# Patient Record
Sex: Male | Born: 1945 | Race: White | Hispanic: No | Marital: Married | Smoking: Former smoker
Health system: Southern US, Community
[De-identification: ages and names within clinical notes are randomized; demographics above are authoritative.]

## PROBLEM LIST (undated history)

## (undated) DIAGNOSIS — I2510 Atherosclerotic heart disease of native coronary artery without angina pectoris: Secondary | ICD-10-CM

## (undated) DIAGNOSIS — L0211 Cutaneous abscess of neck: Secondary | ICD-10-CM

## (undated) DIAGNOSIS — R0602 Shortness of breath: Secondary | ICD-10-CM

## (undated) DIAGNOSIS — F419 Anxiety disorder, unspecified: Secondary | ICD-10-CM

## (undated) DIAGNOSIS — E039 Hypothyroidism, unspecified: Secondary | ICD-10-CM

## (undated) DIAGNOSIS — G4730 Sleep apnea, unspecified: Secondary | ICD-10-CM

## (undated) DIAGNOSIS — F32A Depression, unspecified: Secondary | ICD-10-CM

## (undated) DIAGNOSIS — K219 Gastro-esophageal reflux disease without esophagitis: Secondary | ICD-10-CM

## (undated) DIAGNOSIS — I422 Other hypertrophic cardiomyopathy: Secondary | ICD-10-CM

## (undated) DIAGNOSIS — I10 Essential (primary) hypertension: Secondary | ICD-10-CM

## (undated) DIAGNOSIS — A4902 Methicillin resistant Staphylococcus aureus infection, unspecified site: Secondary | ICD-10-CM

## (undated) DIAGNOSIS — F329 Major depressive disorder, single episode, unspecified: Secondary | ICD-10-CM

## (undated) DIAGNOSIS — Z9289 Personal history of other medical treatment: Secondary | ICD-10-CM

## (undated) HISTORY — DX: Cutaneous abscess of neck: L02.11

## (undated) HISTORY — DX: Essential (primary) hypertension: I10

## (undated) HISTORY — DX: Personal history of other medical treatment: Z92.89

## (undated) HISTORY — PX: BACK SURGERY: SHX140

## (undated) HISTORY — DX: Methicillin resistant Staphylococcus aureus infection, unspecified site: A49.02

## (undated) HISTORY — PX: ABSCESS DRAINAGE: SHX1119

## (undated) HISTORY — DX: Atherosclerotic heart disease of native coronary artery without angina pectoris: I25.10

## (undated) HISTORY — DX: Other hypertrophic cardiomyopathy: I42.2

---

## 1999-12-31 ENCOUNTER — Emergency Department (HOSPITAL_COMMUNITY): Admission: EM | Admit: 1999-12-31 | Discharge: 1999-12-31 | Payer: Self-pay | Admitting: Emergency Medicine

## 2000-01-02 ENCOUNTER — Encounter: Admission: RE | Admit: 2000-01-02 | Discharge: 2000-04-01 | Payer: Self-pay | Admitting: Orthopedic Surgery

## 2000-04-20 ENCOUNTER — Encounter: Admission: RE | Admit: 2000-04-20 | Discharge: 2000-07-19 | Payer: Self-pay | Admitting: Orthopedic Surgery

## 2000-07-20 ENCOUNTER — Encounter: Admission: RE | Admit: 2000-07-20 | Discharge: 2000-10-18 | Payer: Self-pay | Admitting: Orthopedic Surgery

## 2000-07-29 ENCOUNTER — Emergency Department (HOSPITAL_COMMUNITY): Admission: EM | Admit: 2000-07-29 | Discharge: 2000-07-29 | Payer: Self-pay | Admitting: Emergency Medicine

## 2000-11-02 ENCOUNTER — Encounter: Admission: RE | Admit: 2000-11-02 | Discharge: 2001-01-31 | Payer: Self-pay | Admitting: Orthopedic Surgery

## 2001-02-01 ENCOUNTER — Encounter: Admission: RE | Admit: 2001-02-01 | Discharge: 2001-05-02 | Payer: Self-pay | Admitting: Orthopedic Surgery

## 2001-05-24 ENCOUNTER — Encounter: Admission: RE | Admit: 2001-05-24 | Discharge: 2001-08-22 | Payer: Self-pay | Admitting: Orthopedic Surgery

## 2002-01-03 ENCOUNTER — Encounter (HOSPITAL_BASED_OUTPATIENT_CLINIC_OR_DEPARTMENT_OTHER): Admission: RE | Admit: 2002-01-03 | Discharge: 2002-03-10 | Payer: Self-pay | Admitting: Internal Medicine

## 2002-01-12 ENCOUNTER — Ambulatory Visit (HOSPITAL_COMMUNITY): Admission: RE | Admit: 2002-01-12 | Discharge: 2002-01-12 | Payer: Self-pay | Admitting: Orthopedic Surgery

## 2002-01-12 ENCOUNTER — Encounter: Payer: Self-pay | Admitting: Orthopedic Surgery

## 2002-03-30 ENCOUNTER — Encounter (HOSPITAL_BASED_OUTPATIENT_CLINIC_OR_DEPARTMENT_OTHER): Admission: RE | Admit: 2002-03-30 | Discharge: 2002-06-28 | Payer: Self-pay | Admitting: Internal Medicine

## 2002-05-21 ENCOUNTER — Inpatient Hospital Stay (HOSPITAL_COMMUNITY): Admission: EM | Admit: 2002-05-21 | Discharge: 2002-05-25 | Payer: Self-pay | Admitting: Emergency Medicine

## 2002-05-21 ENCOUNTER — Encounter: Payer: Self-pay | Admitting: Emergency Medicine

## 2002-05-22 ENCOUNTER — Encounter: Payer: Self-pay | Admitting: Cardiology

## 2002-05-23 ENCOUNTER — Encounter: Payer: Self-pay | Admitting: Cardiology

## 2002-05-24 ENCOUNTER — Encounter: Payer: Self-pay | Admitting: Internal Medicine

## 2002-07-14 ENCOUNTER — Encounter (HOSPITAL_BASED_OUTPATIENT_CLINIC_OR_DEPARTMENT_OTHER): Admission: RE | Admit: 2002-07-14 | Discharge: 2002-10-12 | Payer: Self-pay | Admitting: Internal Medicine

## 2003-01-04 ENCOUNTER — Encounter (HOSPITAL_BASED_OUTPATIENT_CLINIC_OR_DEPARTMENT_OTHER): Admission: RE | Admit: 2003-01-04 | Discharge: 2003-04-04 | Payer: Self-pay | Admitting: Internal Medicine

## 2003-04-09 ENCOUNTER — Observation Stay (HOSPITAL_COMMUNITY): Admission: RE | Admit: 2003-04-09 | Discharge: 2003-04-10 | Payer: Self-pay | Admitting: Urology

## 2003-04-09 ENCOUNTER — Encounter (INDEPENDENT_AMBULATORY_CARE_PROVIDER_SITE_OTHER): Payer: Self-pay

## 2003-07-16 ENCOUNTER — Ambulatory Visit (HOSPITAL_COMMUNITY): Admission: RE | Admit: 2003-07-16 | Discharge: 2003-07-16 | Payer: Self-pay | Admitting: Urology

## 2003-08-20 ENCOUNTER — Ambulatory Visit (HOSPITAL_COMMUNITY): Admission: RE | Admit: 2003-08-20 | Discharge: 2003-08-20 | Payer: Self-pay | Admitting: Urology

## 2003-12-21 ENCOUNTER — Inpatient Hospital Stay (HOSPITAL_BASED_OUTPATIENT_CLINIC_OR_DEPARTMENT_OTHER): Admission: RE | Admit: 2003-12-21 | Discharge: 2003-12-21 | Payer: Self-pay | Admitting: Cardiology

## 2004-05-16 ENCOUNTER — Encounter (INDEPENDENT_AMBULATORY_CARE_PROVIDER_SITE_OTHER): Payer: Self-pay | Admitting: *Deleted

## 2004-05-16 ENCOUNTER — Inpatient Hospital Stay (HOSPITAL_COMMUNITY): Admission: AD | Admit: 2004-05-16 | Discharge: 2004-05-19 | Payer: Self-pay | Admitting: General Surgery

## 2004-07-01 ENCOUNTER — Encounter (HOSPITAL_BASED_OUTPATIENT_CLINIC_OR_DEPARTMENT_OTHER): Admission: RE | Admit: 2004-07-01 | Discharge: 2004-09-10 | Payer: Self-pay | Admitting: Internal Medicine

## 2004-09-16 ENCOUNTER — Encounter (HOSPITAL_BASED_OUTPATIENT_CLINIC_OR_DEPARTMENT_OTHER): Admission: RE | Admit: 2004-09-16 | Discharge: 2004-12-10 | Payer: Self-pay | Admitting: Surgery

## 2004-12-12 ENCOUNTER — Encounter (HOSPITAL_BASED_OUTPATIENT_CLINIC_OR_DEPARTMENT_OTHER): Admission: RE | Admit: 2004-12-12 | Discharge: 2005-03-12 | Payer: Self-pay | Admitting: Surgery

## 2005-03-24 ENCOUNTER — Encounter (HOSPITAL_BASED_OUTPATIENT_CLINIC_OR_DEPARTMENT_OTHER): Admission: RE | Admit: 2005-03-24 | Discharge: 2005-06-22 | Payer: Self-pay | Admitting: Surgery

## 2005-06-23 ENCOUNTER — Encounter (HOSPITAL_BASED_OUTPATIENT_CLINIC_OR_DEPARTMENT_OTHER): Admission: RE | Admit: 2005-06-23 | Discharge: 2005-09-21 | Payer: Self-pay | Admitting: Surgery

## 2005-09-28 ENCOUNTER — Encounter (HOSPITAL_BASED_OUTPATIENT_CLINIC_OR_DEPARTMENT_OTHER): Admission: RE | Admit: 2005-09-28 | Discharge: 2005-12-27 | Payer: Self-pay | Admitting: Surgery

## 2005-10-12 ENCOUNTER — Ambulatory Visit: Payer: Self-pay | Admitting: Internal Medicine

## 2005-10-21 ENCOUNTER — Ambulatory Visit: Payer: Self-pay

## 2005-12-30 ENCOUNTER — Encounter (HOSPITAL_BASED_OUTPATIENT_CLINIC_OR_DEPARTMENT_OTHER): Admission: RE | Admit: 2005-12-30 | Discharge: 2006-03-11 | Payer: Self-pay | Admitting: Surgery

## 2006-03-31 ENCOUNTER — Encounter (HOSPITAL_BASED_OUTPATIENT_CLINIC_OR_DEPARTMENT_OTHER): Admission: RE | Admit: 2006-03-31 | Discharge: 2006-04-19 | Payer: Self-pay | Admitting: Internal Medicine

## 2006-05-10 ENCOUNTER — Inpatient Hospital Stay (HOSPITAL_COMMUNITY): Admission: AD | Admit: 2006-05-10 | Discharge: 2006-05-14 | Payer: Self-pay | Admitting: Internal Medicine

## 2006-05-11 ENCOUNTER — Encounter: Payer: Self-pay | Admitting: Vascular Surgery

## 2006-05-13 ENCOUNTER — Encounter (INDEPENDENT_AMBULATORY_CARE_PROVIDER_SITE_OTHER): Payer: Self-pay | Admitting: *Deleted

## 2006-09-23 ENCOUNTER — Ambulatory Visit: Payer: Self-pay | Admitting: Internal Medicine

## 2006-10-20 ENCOUNTER — Ambulatory Visit: Payer: Self-pay | Admitting: Internal Medicine

## 2006-10-20 ENCOUNTER — Encounter: Payer: Self-pay | Admitting: Internal Medicine

## 2007-03-20 ENCOUNTER — Inpatient Hospital Stay (HOSPITAL_COMMUNITY): Admission: EM | Admit: 2007-03-20 | Discharge: 2007-03-23 | Payer: Self-pay | Admitting: Emergency Medicine

## 2007-04-04 ENCOUNTER — Encounter: Admission: RE | Admit: 2007-04-04 | Discharge: 2007-04-04 | Payer: Self-pay | Admitting: Internal Medicine

## 2007-04-18 ENCOUNTER — Encounter: Admission: RE | Admit: 2007-04-18 | Discharge: 2007-04-18 | Payer: Self-pay | Admitting: Internal Medicine

## 2008-07-25 ENCOUNTER — Observation Stay (HOSPITAL_COMMUNITY): Admission: EM | Admit: 2008-07-25 | Discharge: 2008-07-26 | Payer: Self-pay | Admitting: Emergency Medicine

## 2008-07-25 ENCOUNTER — Ambulatory Visit: Payer: Self-pay | Admitting: Internal Medicine

## 2009-10-31 ENCOUNTER — Encounter (INDEPENDENT_AMBULATORY_CARE_PROVIDER_SITE_OTHER): Payer: Self-pay | Admitting: *Deleted

## 2009-11-14 ENCOUNTER — Emergency Department (HOSPITAL_COMMUNITY): Admission: EM | Admit: 2009-11-14 | Discharge: 2009-11-14 | Payer: Self-pay | Admitting: Emergency Medicine

## 2009-12-03 ENCOUNTER — Encounter (HOSPITAL_BASED_OUTPATIENT_CLINIC_OR_DEPARTMENT_OTHER): Admission: RE | Admit: 2009-12-03 | Discharge: 2010-02-06 | Payer: Self-pay | Admitting: General Surgery

## 2009-12-06 ENCOUNTER — Ambulatory Visit: Payer: Self-pay | Admitting: Vascular Surgery

## 2010-01-20 ENCOUNTER — Encounter: Payer: Self-pay | Admitting: Internal Medicine

## 2010-02-25 ENCOUNTER — Encounter (INDEPENDENT_AMBULATORY_CARE_PROVIDER_SITE_OTHER): Payer: Self-pay | Admitting: *Deleted

## 2010-02-26 ENCOUNTER — Encounter (INDEPENDENT_AMBULATORY_CARE_PROVIDER_SITE_OTHER): Payer: Self-pay | Admitting: *Deleted

## 2010-02-26 ENCOUNTER — Ambulatory Visit: Payer: Self-pay | Admitting: Internal Medicine

## 2010-03-12 ENCOUNTER — Ambulatory Visit: Payer: Self-pay | Admitting: Internal Medicine

## 2010-03-22 ENCOUNTER — Encounter: Payer: Self-pay | Admitting: Internal Medicine

## 2010-07-15 NOTE — Letter (Signed)
Summary: [facility] Instructions  [facility]  [address] [place], [place] [address]   Phone: [phone]  Fax: [phone]       [name]    [birth year]    MRN: [id number]        Procedure Day Dorna Bloom:  [facility]  03/12/10     Arrival Time:  9:30AM     Procedure Time:  10:30AM     Location of Procedure:                    _ X_   Endoscopy Center (4th Floor)                       PREPARATION FOR COLONOSCOPY WITH MOVIPREP   Starting 5 days prior to your procedure 03/07/10 do not eat nuts, seeds, popcorn, corn, beans, peas,  salads, or any raw vegetables.  Do not take any fiber supplements (e.g. Metamucil, Citrucel, and Benefiber).  THE DAY BEFORE YOUR PROCEDURE         DATE: 03/11/10  DAY: TUESDAY  1.  Drink clear liquids the entire day-NO SOLID FOOD  2.  Do not drink anything colored red or purple.  Avoid juices with pulp.  No orange juice.  3.  Drink at least 64 oz. (8 glasses) of fluid/clear liquids during the day to prevent dehydration and help the prep work efficiently.  CLEAR LIQUIDS INCLUDE: Water Jello Ice Popsicles Tea (sugar ok, no milk/cream) Powdered fruit flavored drinks Coffee (sugar ok, no milk/cream) Gatorade Juice: apple, white grape, white cranberry  Lemonade Clear bullion, consomm, broth Carbonated beverages (any kind) Strained chicken noodle soup Hard Candy                             4.  In the morning, mix first dose of MoviPrep solution:    Empty 1 Pouch A and 1 Pouch B into the disposable container    Add lukewarm drinking water to the top line of the container. Mix to dissolve    Refrigerate (mixed solution should be used within 24 hrs)  5.  Begin drinking the prep at 5:00 p.m. The MoviPrep container is divided by 4 marks.   Every 15 minutes drink the solution down to the next mark (approximately 8 oz) until the full liter is complete.   6.  Follow completed prep with 16 oz of clear liquid of your choice  (Nothing red or purple).  Continue to drink clear liquids until bedtime.  7.  Before going to bed, mix second dose of MoviPrep solution:    Empty 1 Pouch A and 1 Pouch B into the disposable container    Add lukewarm drinking water to the top line of the container. Mix to dissolve    Refrigerate  THE DAY OF YOUR PROCEDURE      DATE: 03/12/10  DAY: WEDNESDAY  Beginning at 5:30AM (5 hours before procedure):         1. Every 15 minutes, drink the solution down to the next mark (approx 8 oz) until the full liter is complete.  2. Follow completed prep with 16 oz. of clear liquid of your choice.    3. You may drink clear liquids until 8:30AM (2 HOURS BEFORE PROCEDURE).   MEDICATION INSTRUCTIONS  Unless otherwise instructed, you should take regular prescription medications with a small sip of water   as early as possible the morning of  your procedure.  Diabetic patients - see separate instructions.  Additional medication instructions: Hold Benicar HCT the morning of procedure.         OTHER INSTRUCTIONS  You will need a responsible adult at least 67 years of age to accompany you and drive you home.   This person must remain in the waiting room during your procedure.  Wear loose fitting clothing that is easily removed.  Leave jewelry and other valuables at home.  However, you may wish to bring a book to read or  an iPod/MP3 player to listen to music as you wait for your procedure to start.  Remove all body piercing jewelry and leave at home.  Total time from sign-in until discharge is approximately 2-3 hours.  You should go home directly after your procedure and rest.  You can resume normal activities the  day after your procedure.  The day of your procedure you should not:   Drive   Make legal decisions   Operate machinery   Drink alcohol   Return to work  You will receive specific instructions about eating, activities and medications before you leave.    The  above instructions have been reviewed and explained to me by   Wyona Almas RN  February 26, 2010 11:26 AM     I fully understand and can verbalize these instructions _____________________________ Date _________

## 2010-07-15 NOTE — Procedures (Signed)
Summary: Colonoscopy  Patient: [name] Note: All result statuses are Final unless otherwise noted.  Tests: (1) Colonoscopy (COL)   COL Colonoscopy           DONE     [facility]     [address]     [place], [place]  [address]           COLONOSCOPY PROCEDURE REPORT           PATIENT:  [name], [name]  MR#:  [id number]     BIRTHDATE:  [birth year], 64 yrs. old  GENDER:  male     ENDOSCOPIST:  Wilhemina Bonito. Eda Keys, MD     REF. BY:  Surveillance Program Recall     PROCEDURE DATE:  03/12/2010     PROCEDURE:  Colonoscopy with snare polypectomy x 2     ASA CLASS:  Class II     INDICATIONS:  history of pre-cancerous (adenomatous) colon polyps,     surveillance and high-risk screening     MEDICATIONS:   Fentanyl 75 mcg IV, Versed 7 mg IV           DESCRIPTION OF PROCEDURE:   After the risks benefits and     alternatives of the procedure were thoroughly explained, informed     consent was obtained.  Digital rectal exam was performed and     revealed no abnormalities.   The LB CF-[id number] endoscope     was introduced through the anus and advanced to the cecum, which     was identified by both the appendix and ileocecal valve, without     limitations.Time to cecum = 3:24 min. The quality of the prep was     excellent, using MoviPrep.  The instrument was then slowly     withdrawn (time = 12:00 min) as the colon was fully examined.     <<PROCEDUREIMAGES>>           FINDINGS:  A 5mm pedunculated polyp was found in the ascending     colon. Polyp was snared without cautery. Retrieval was successful.     A diminutive polyp was found in the mid transverse colon. Polyp     was snared without cautery. Retrieval was unsuccessful.  Moderate     diverticulosis was found in the  ascending colon and sigmoid     colon.   Retroflexed views in the rectum revealed no     abnormalities.    The scope was then withdrawn from the patient     and the procedure completed.        COMPLICATIONS:  None           ENDOSCOPIC IMPRESSION:     1) Pedunculated polyp in the ascending colon - removed     2) Diminutive polyp in the mid transverse colon - removed     3) Moderate diverticulosis ascending colon and sigmoid colon           RECOMMENDATIONS:     1) Follow up colonoscopy in 5 years           ______________________________     Wilhemina Bonito. Eda Keys, MD           CC:  Jarome Matin, MD;The Patient           n.     Rosalie DoctorWilhemina Bonito. Eda Keys at 03/12/2010 12:07 PM           Izora Ribas, [id number]  Note:  An exclamation mark (!) indicates a result that was not dispersed into the flowsheet. Document Creation Date: 03/12/2010 12:08 PM _______________________________________________________________________  (1) Order result status: Final Collection or observation date-time: 03/12/2010 12:00 Requested date-time:  Receipt date-time:  Reported date-time:  Referring Physician:   Ordering Physician: Fransico Setters [phone]) Specimen Source:  Source: Launa Grill Order Number: [id number] Lab site:   Appended Document: Colonoscopy recall 5 yrs     Procedures Next Due Date:    Colonoscopy: 03/2015

## 2010-07-15 NOTE — Letter (Signed)
Summary: Colonoscopy Letter  [facility]  [address] [place], [place] [address]   Phone: [phone]  Fax: [phone]      Oct 31, 2009 MRN: [id number]   [name] [address] [place], [place]  [address]   Dear [name],   According to your medical record, it is time for you to schedule a Colonoscopy. The American Cancer Society recommends this procedure as a method to detect early colon cancer. Patients with a family history of colon cancer, or a personal history of colon polyps or inflammatory bowel disease are at increased risk.  This letter has beeen generated based on the recommendations made at the time of your procedure. If you feel that in your particular situation this may no longer apply, please contact our office.  Please call our office at [phone] to schedule this appointment or to update your records at your earliest convenience.  Thank you for cooperating with [place] to provide you with the very best care possible.   Sincerely,  Wilhemina Bonito. Marina Goodell, M.D.  [facility] [phone]

## 2010-07-15 NOTE — Letter (Signed)
Summary: Patient Notice- Polyp Results  [facility]  [address] [place], [place] [address]   Phone: [phone]  Fax: [phone]        March 22, 2010 MRN: [id number]    [name] [address] [place], [place]  [address]    Dear Mr. Luger,  I am pleased to inform you that the colon polyp(s) removed during your recent colonoscopy was (were) found to be benign (no cancer detected) upon pathologic examination.  I recommend you have a repeat colonoscopy examination in 5 years to look for recurrent polyps, as having colon polyps increases your risk for having recurrent polyps or even colon cancer in the future.  Should you develop new or worsening symptoms of abdominal pain, bowel habit changes or bleeding from the rectum or bowels, please schedule an evaluation with either your primary care physician or with me.  Additional information/recommendations:  __ No further action with gastroenterology is needed at this time. Please      follow-up with your primary care physician for your other healthcare      needs.   Please call us if you are having persistent problems or have questions about your condition that have not been fully answered at this time.  Sincerely,  Hilarie Fredrickson MD  This letter has been electronically signed by your physician.  Appended Document: Patient Notice- Polyp Results letter mailed

## 2010-07-15 NOTE — Miscellaneous (Signed)
Summary: [facility] Pervisit/prep  Clinical Lists Changes  Medications: Added new medication of MOVIPREP 100 GM  SOLR (PEG-KCL-NACL-NASULF-NA ASC-C) As per prep instructions. - Signed Rx of MOVIPREP 100 GM  SOLR (PEG-KCL-NACL-NASULF-NA ASC-C) As per prep instructions.;  #1 x 0;  Signed;  Entered by: Wyona Almas RN;  Authorized by: Hilarie Fredrickson MD;  Method used: Electronically to [facility]  [address] [id number] [address] [place], [place]  [address], Ph: [phone] or [phone], Fax: [phone] Allergies: Added new allergy or adverse reaction of PENICILLIN Observations: Added new observation of NKA: F (02/26/2010 10:34)    Prescriptions: MOVIPREP 100 GM  SOLR (PEG-KCL-NACL-NASULF-NA ASC-C) As per prep instructions.  #1 x 0   Entered by:   Wyona Almas RN   Authorized by:   Hilarie Fredrickson MD   Signed by:   Wyona Almas RN on 02/26/2010   Method used:   [facility]ronically to        [facility]  [address] [id number] ([facility]ail)       [address]       [place], [place]  [address]       Ph: [phone] or [phone]       Fax: [phone]   RxID:   [id number]

## 2010-07-15 NOTE — Letter (Signed)
Summary: Previsit letter  [facility]  [address] [place], [place] [address]   Phone: [phone]  Fax: [phone]       01/20/2010 MRN: [id number]  [name] [address] [place], [place]  [address]  Dear [name],  Welcome to the Gastroenterology Division at [facility].    You are scheduled to see a nurse for your pre-procedure visit on 02-26-10 at 11am on the 3rd floor at [facility], [address]  We ask that you try to arrive at our office 15 minutes prior to your appointment time to allow for check-in.  Your nurse visit will consist of discussing your medical and surgical history, your immediate family medical history, and your medications.    Please bring a complete list of all your medications or, if you prefer, bring the medication bottles and we will list them.  We will need to be aware of both prescribed and over the counter drugs.  We will need to know exact dosage information as well.  If you are on blood thinners (Coumadin, Plavix, Aggrenox, Ticlid, etc.) please call our office today/prior to your appointment, as we need to consult with your physician about holding your medication.   Please be prepared to read and sign documents such as consent forms, a financial agreement, and acknowledgement forms.  If necessary, and with your consent, a friend or relative is welcome to sit-in on the nurse visit with you.  Please bring your insurance card so that we may make a copy of it.  If your insurance requires a referral to see a specialist, please bring your referral form from your primary care physician.  No co-pay is required for this nurse visit.     If you cannot keep your appointment, please call [phone] to cancel or reschedule prior to your appointment date.  This allows [place] the opportunity to schedule an appointment for another patient in need of care.    Thank you for choosing [facility] for your medical needs.  We  appreciate the opportunity to care for you.  Please visit [place] at our website  to learn more about our practice.                     Sincerely.                                                                                                                   The Gastroenterology Division

## 2010-07-15 NOTE — Letter (Signed)
Summary: Diabetic Instructions  [facility]  [address] [place], [place] [address]   Phone: [phone]  Fax: [phone]    WORTH KOBER 1945/07/04 MRN: [id number]    ________________________________________________________________________  _ x _   INSULIN (LONG ACTING) MEDICATION INSTRUCTIONS (Lantus, NPH, 70/30, Humulin, Novolin-N)   The day before your procedure:   Take  your regular evening dose    The day of your procedure:   Do not take your morning dose

## 2010-08-28 LAB — GLUCOSE, CAPILLARY
Glucose-Capillary: 175 mg/dL — ABNORMAL HIGH (ref 70–99)
Glucose-Capillary: 176 mg/dL — ABNORMAL HIGH (ref 70–99)

## 2010-09-01 LAB — GLUCOSE, CAPILLARY: Glucose-Capillary: 219 mg/dL — ABNORMAL HIGH (ref 70–99)

## 2010-09-30 LAB — GLUCOSE, CAPILLARY
Glucose-Capillary: 169 mg/dL — ABNORMAL HIGH (ref 70–99)
Glucose-Capillary: 181 mg/dL — ABNORMAL HIGH (ref 70–99)

## 2010-09-30 LAB — HEMOGLOBIN A1C
Hgb A1c MFr Bld: 6.3 % — ABNORMAL HIGH (ref 4.6–6.1)
Mean Plasma Glucose: 134 mg/dL

## 2010-09-30 LAB — POCT CARDIAC MARKERS
CKMB, poc: 6.2 ng/mL (ref 1.0–8.0)
Troponin i, poc: <0.05 ng/mL (ref 0.00–0.09)

## 2010-09-30 LAB — LIPID PANEL: Cholesterol: 108 mg/dL (ref 0–200)

## 2010-09-30 LAB — CARDIAC PANEL(CRET KIN+CKTOT+MB+TROPI)
CK, MB: 4 ng/mL (ref 0.3–4.0)
CK, MB: 4.5 ng/mL — ABNORMAL HIGH (ref 0.3–4.0)
Relative Index: 2.9 — ABNORMAL HIGH (ref 0.0–2.5)
Relative Index: 3.1 — ABNORMAL HIGH (ref 0.0–2.5)
Total CK: 136 U/L (ref 7–232)
Total CK: 145 U/L (ref 7–232)
Troponin I: 0.01 ng/mL (ref 0.00–0.06)

## 2010-09-30 LAB — URINALYSIS, ROUTINE W REFLEX MICROSCOPIC
Glucose, UA: NEGATIVE mg/dL
Ketones, ur: NEGATIVE mg/dL
Protein, ur: 30 mg/dL — AB

## 2010-09-30 LAB — DIFFERENTIAL
Basophils Absolute: 0.1 10*3/uL (ref 0.0–0.1)
Eosinophils Absolute: 0.3 10*3/uL (ref 0.0–0.7)
Eosinophils Relative: 3 % (ref 0–5)
Lymphocytes Relative: 25 % (ref 12–46)

## 2010-09-30 LAB — BASIC METABOLIC PANEL
BUN: 21 mg/dL (ref 6–23)
CO2: 28 mEq/L (ref 19–32)
Calcium: 8.8 mg/dL (ref 8.4–10.5)
Creatinine, Ser: 1.01 mg/dL (ref 0.4–1.5)
Creatinine, Ser: 1.09 mg/dL (ref 0.4–1.5)
GFR calc non Af Amer: >60 mL/min (ref >60)
GFR calc non Af Amer: >60 mL/min (ref >60)
Glucose, Bld: 119 mg/dL — ABNORMAL HIGH (ref 70–99)
Glucose, Bld: 194 mg/dL — ABNORMAL HIGH (ref 70–99)
Potassium: 4 mEq/L (ref 3.5–5.1)

## 2010-09-30 LAB — CBC
HCT: 42.3 % (ref 39.0–52.0)
MCHC: 34.7 g/dL (ref 30.0–36.0)
Platelets: 130 10*3/uL — ABNORMAL LOW (ref 150–400)
Platelets: 140 10*3/uL — ABNORMAL LOW (ref 150–400)
RDW: 13.3 % (ref 11.5–15.5)
RDW: 13.3 % (ref 11.5–15.5)

## 2010-09-30 LAB — CK TOTAL AND CKMB (NOT AT ARMC)
CK, MB: 5.8 ng/mL — ABNORMAL HIGH (ref 0.3–4.0)
Relative Index: 3.3 — ABNORMAL HIGH (ref 0.0–2.5)

## 2010-09-30 LAB — MICROALBUMIN / CREATININE URINE RATIO: Creatinine, Urine: 101.7 mg/dL

## 2010-09-30 LAB — PROTIME-INR: Prothrombin Time: 13.3 seconds (ref 11.6–15.2)

## 2010-10-28 NOTE — Discharge Summary (Signed)
NAME:  [name], [name] NO.:  [id number]   MEDICAL RECORD NO.:  [id number]          PATIENT TYPE:  OBV   LOCATION:  4709                         FACILITY:  [facility]   PHYSICIAN:  Verne Carrow, MDDATE OF BIRTH:  11-Jan-1946   DATE OF ADMISSION:  07/25/2008  DATE OF DISCHARGE:  07/26/2008                               DISCHARGE SUMMARY   PRIMARY CARDIOLOGIST:  Duke Salvia, MD, [facility].   PRIMARY MEDICAL DOCTOR:  Barry Dienes. Eloise Harman, M.D.   DISCHARGE DIAGNOSIS:  Noncardiac chest pain.   SECONDARY DIAGNOSES:  1. Coronary artery disease (nonobstructive per catheterization this      admission).  2. Congestive heart failure diastolic with a normal ejection fraction,  3. Obstructive sleep apnea (on continuous positive airway pressure).  4. Diabetes mellitus type 2 (with neuropathy, nephropathy,      retinopathy, status post ulcers including methicillin-resistant      Staphylococcus aureus).  5. Chronic obstructive pulmonary disease.  6. Hypertension.  7. Hypothyroidism.  8. Gout.  9. Diverticulosis (chronic constipation).  10.Degenerative joint disease (lumbar spine).   ALLERGIES:  PENICILLIN.   PROCEDURES PERFORMED DURING THIS HOSPITALIZATION:  1. EKG performed on July 25, 2008, which showed sinus bradycardia      with a rate of 57 beats per minute, T-wave flattening in leads V5      and V6, some minimal T-wave inversion in aVL, otherwise no      significant ST-T wave changes, no significant Q waves, normal axis,      and no significant changes from EKG performed on May 11, 2006.  2. Chest x-ray performed on July 25, 2008, showed no active      disease.  3. There was a cardiac catheterization on July 26, 2008, which      showed nonobstructive coronary artery disease.   BRIEF HISTORY OF PRESENT ILLNESS:  This 65 year old white male with past  medical history significant for coronary artery disease (mild lesion of  RCA per cath in 2005),  diastolic dysfunction, diabetes mellitus type 2,  obstructive sleep apnea, hypertension, and hypothyroidism, presents with  atypical chest pain.  Pain is in the mid chest and radiates to both  sides of the chest.  Pain is associated with movement and with palpation  of the pectoral muscles and not with exertion.  Pain is relieved with  rest/lying still.  The patient first noticed this pain 1 week ago and  has progressively worsened.  He has had associated shortness of breath  today with exertion.  Denies nausea, vomiting, diaphoresis, cough,  recent weight lifting, or trauma.  His appetite and energy level had  been normal.  Please note, the patient has had an intentional weight  loss of approximately 40 pounds over the last 5 months due to diet  supplementation 6 days per week.   HOSPITAL COURSE:  The patient was admitted with the intention of cycling  his cardiac enzymes to rule him out for acute coronary syndrome.  However, the patient had 3 sets of elevated CK-MB with an elevated  relative index as well.  However, his troponin I  was consistently less  than 0.05.  The patient also developed worsening of chest pain with  exertion on July 26, 2008, and considering his significant risk  factors including known prior history of coronary artery disease,  diastolic dysfunction, diabetes mellitus, hypertension, and extremely  low HDL result and elevated triglycerides as mentioned this admission,  the patient was scheduled for a diagnostic cardiac catheterization on  July 26, 2008.  The patient underwent that procedure and was seemed  to have nonobstructive coronary artery disease.  He tolerated the  procedure well with no significant complications.  The patient was also  discovered to have a low TSH level.  The results of his lab work as well  as his cardiac catheterization were discussed by Dr. Clifton James with the  patient's primary care doctor, Dr. Eloise Harman, and along with the plan  to  forego any further cardiac workup and have the patient follow up with  his primary care doctor within the next 2 weeks.  During hospital  course, the patient's vital signs remained stable and nothing  significant was noted on telemetry.  Most recent vital signs on day of  discharge were temperature 96.6 degrees Fahrenheit, BP 149/71, pulse 51,  respirations 20, and O2 saturation 96% on room air.  Prior to discharge,  the patient was given post cath instructions as well as followup  instructions with his new medication list in both oral and written form,  and at the time of discharge, he had no questions or concerns that had  not been addressed.   DISCHARGE LABORATORY DATA:  WBC 8.2, hgb 13.1, HCT 37.6, PLT count 130,  glucose range 119-194, and hemoglobin A1c was 6.3.  Sodium 136,  potassium 3.8, chloride 101, CO2 28, BUN 24, creatinine 1.09, calcium  8.8, total cholesterol 108, triglycerides 374, HDL 18, LDL 15, and VLDL  75.  Last set of cardiac markers, CK 136, CK-MB 4.0, relative index 2.9,  troponin I 0.03, TSH 0.017.  Urinalysis only abnormal values were trace  blood and 30 of protein.   FOLLOWUP PLANS AND APPOINTMENTS:  The patient will follow up with his  primary care doctor preferably within the next 2 weeks.   DISCHARGE MEDICATIONS:  1. NovoLog insulin sliding scale.  2. Benicar 40 mg p.o. daily.  3. Synthroid 200 mcg p.o. daily.  4. Protonix 40 mg p.o. daily.   Duration of discharge encounter including physician time was 35 minutes.      Jarrett Ables, [facility]      Verne Carrow, MD  Electronically Signed   MS/MEDQ  D:  07/26/2008  T:  07/27/2008  Job:  [id number]   cc:   Duke Salvia, MD, [facility]  Barry Dienes Eloise Harman, M.D.

## 2010-10-28 NOTE — H&P (Signed)
[name], [name] NO.:  [id number]   MEDICAL RECORD NO.:  [id number]          PATIENT TYPE:  EMS   LOCATION:  MAJO                         FACILITY:  [facility]   PHYSICIAN:  Barry Dienes. Eloise Harman, M.D.DATE OF BIRTH:  [birth year]   DATE OF ADMISSION:  03/20/2007  DATE OF DISCHARGE:                              HISTORY & PHYSICAL   CHIEF COMPLAINT:  Severe low back pain.   HISTORY OF PRESENT ILLNESS:  The patient is a 65 year old white male who  is well-known to me as I serve as his primary care physician.  Last  weekend he noted a gradual increase in his chronic low back pain.  The  pain initially increased throughout the week, then gradually became  somewhat less intense.  On March 18, 2007, he was standing much to the  day, cooking food for family and friends.  On March 19, 2007, when he  was getting out of his recliner, he noticed a sudden increase in his low  back pain so that he was barely able to get to his room with a walker  and assistance.  Today the pain has been quite severe and he has been  unable to walk at all, so he called 9-1-1 and presented to the [facility] Emergency Room for evaluation.  The pain continues to be quite  severe despite lying on his left side with shooting severe pain to the  right lower extremity.  He denies change in bowel or bladder function.  He has chronic bilateral numb feet from diabetic neuropathy.  In the  1980s, he had three operations on his lumbar spine for degenerative disk  disease and osteoarthritis.  His last operation was in the 50s at [facility].   PAST MEDICAL HISTORY:  1. Hypertension.  2. Diabetes mellitus, type 2, complicated by retinopathy, neuropathy,      nephropathy, and a left neuropathic foot ulcer.  3. Hypothyroidism.  4. Severe obstructive sleep apnea on CPAP.  5. Congestive heart failure with diastolic dysfunction and normal left      ventricular systolic function on December 2007  echocardiogram.  6. Gout.  7. Chronic obstructive pulmonary disease.  8. Diverticulitis.  9. Colon polyps on 2004 colonoscopy with a repeat colonoscopy planned      for this year.  10.Chronic constipation.  11.Gastroesophageal reflux disease.  12.Nonobstructive coronary artery disease by 2005 cardiac      catheterization.  13.December 2005, bilateral hidradenitis with MRSA treated by surgery      and antibiotics.  14.December 2007, left foot abscess with treated again with      debridement and antibiotics.  15.Chronic venous insufficiency with a history of venostasis ulcers.   MEDICATIONS PRIOR TO ADMISSION:  1. Aciphex 20 mg daily.  2. Benicar 40 mg daily.  3. Levothyroxine 300 mcg every Monday, Wednesday, and Friday and 200      mcg on other days of the week.  4. Multivitamin 1 tablet daily.  5. NovoLog mix 70/30 50 units twice daily with sliding scale      adjustments as needed.  6. Nitroglycerin 0.4 mg sublingual p.r.n. chest pain.  7. MiraLax 17 grams p.o. daily.   ALLERGIES:  PENICILLIN.   PAST SURGICAL HISTORY:  1. 1980s lumbar spine operation.  2. 1980s lumbar spine operation.  3. 1980s lumbar spine operation.  4. 2004 penis implant.  5. 2005 redo penis implant.  6. December 2005 bilateral carbuncle excisions.  7. He has also had several laser treatments to the retina for diabetic      retinopathy.  8. July 2005 cardiac catheterization.   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  His son,  Lorin Picket, age 20, lives with him.  He has a daughter, Karoline Caldwell, who is 81 and  lives in [place], [place].  He has no history of tobacco or  alcohol abuse.   FAMILY HISTORY:  Noncontributory.   REVIEW OF SYSTEMS:  He has chronic bilateral feet numbness and has had a  chronic left foot ulcer since December 2007.  He uses a cane or walker  to ambulate.  He denies recent change in his vision, headache, chest  pain, shortness of breath, nausea, vomiting,  diarrhea, constipation (on  MiraLax), anxiety, or depression.   INITIAL PHYSICAL EXAM:  VITAL SIGNS:  Blood pressure 175/80, pulse 65,  respirations 22, temperature 98.3, pulse oxygen saturation 98% on room  air.  GENERAL APPEARANCE:  He is an overweight white male who had twinges of  severe low back pain while lying on his left side.  HEENT:  Exam was grossly normal.  NECK:  Supple without jugular venous distention or carotid bruit.  CHEST:  Clear to auscultation.  HEART:  Regular rate and rhythm without significant murmur or gallop.  ABDOMEN:  Normal bowel sounds and no hepatosplenomegaly or tenderness.  EXTREMITIES:  Without cyanosis, clubbing, or edema.  There is a scar  along the anterior aspect of the right leg presumably from now healed  stasis ulcers.  The pedal pulses were intact.  He has an ulcer on the  dorsum of the left forefoot that is wrapped in gauze.  Due to his severe  pain, close examination this area is deferred for now.  NEUROLOGICAL:  He was alert and well oriented and able to move all  extremities somewhat.  He has severely decreased light touch sensation  in both lower extremities extending to just distal to the knees.   INITIAL LABORATORY STUDIES:  White blood cells 8.4, hemoglobin 15,  hematocrit 45, platelets 156.  Serum sodium 135, potassium 4.2, chloride  102, BUN 18, creatinine 1.3, glucose 204.   Lumbar spine MRI scan was done with report pending at the time of  dictation.   IMPRESSION AND PLAN:  1. Low back pain:  Severe and likely due to the reportedly present      acute herniated disk.  I plan to initially treat him with Solu-      Medrol and scheduled and p.r.n. pain medications.  Tomorrow we will      have a radiologist evaluate him for the possibility of cortisone      injections locally.  If there is no significant change in his low      back pain with cortisone injections, or this is felt not      appropriate by the MRI findings, then we will  ask a neurosurgeon to      evaluate him.  2. Diabetes mellitus, type 2:  Overall, he is an under reasonable      control.  I expect that his blood glucose levels will rise      significantly on corticosteroids.  We will continue his usual 70/30      insulin dose and add sliding scale insulin as needed.  3. History of coronary artery disease:  Stable on his current medical      regimen.  4. Left foot ulcer:  Chronic and due to diabetic neuropathy.  Due to      his severe low back pain, his examination will be deferred until      tomorrow.  For now, we will apply Iodosorb and gauze to the wound      and arrange for a wound care nurse evaluation tomorrow.           ______________________________  Barry Dienes Eloise Harman, M.D.     DGP/MEDQ  D:  03/20/2007  T:  03/21/2007  Job:  [id number]   cc:   Wilhemina Bonito. Marina Goodell, MD  Nadara Mustard, MD  Arturo Morton. Riley Kill, MD, [facility]  Sigmund I. Patsi Sears, M.D.

## 2010-10-28 NOTE — H&P (Signed)
NAME:  Maldonado Maldonado NO.:  [id number]   MEDICAL RECORD NO.:  [id number]          PATIENT TYPE:  OBV   LOCATION:  4709                         FACILITY:  [facility]   PHYSICIAN:  Maldonado Canning. Ladona Ridgel, MD    DATE OF BIRTH:  10/18/1945   DATE OF ADMISSION:  07/25/2008  DATE OF DISCHARGE:                              HISTORY & PHYSICAL   PRIMARY CARDIOLOGIST:  Troy Salvia, MD, [facility]   PRIMARY CARE PHYSICIAN:  Dr. Jackquline Maldonado.   CHIEF COMPLAINT:  Chest pain.   This is a 65 year old male with past medical history of coronary artery  disease, diabetes, obstructive sleep apnea, hypertension, and  hyperthyroid who presents with 1 week of chest pain.  Pain is in the  midchest radiating to both sides of the chest.  There is no radiation to  the neck, back, or arms.  Pain is associated with movement of the  pectoral muscles and with palpation of the chest, but not with exertion.  Pain is relieved lying still.  Maldonado Maldonado first noticed the pain  approximately a week ago and it has been getting progressively worse.  He has had shortness of breath only on the day of admission with  walking.  He has had no nausea, vomiting, or diaphoresis.  No cough, no  recent weightlifting or trauma to the chest area.  His appetite and  energy level has been normal during this week.  Of note, he has had a 40-  pound weight loss over the past 5 months, which has been intentional.  He has been using Herbalife dietary supplements 6 days per week.   PAST MEDICAL HISTORY:  1. Coronary artery disease.  Cardiac catheterization in 2005 showed      mild lesions of the RCA.  2. CHF.  2-D echo in November 2007 showed a preserved ejection      fraction of 60-65%.  He has a nonobstructive hypertrophic      cardiomyopathy.  He has increased right ventricular systolic      pressures indicating a diastolic dysfunction.  3. Obstructive sleep apnea with CPAP used at home.  4. Diabetes type 2, complicated by  neuropathy, nephropathy,      retinopathy, and history of diabetic leg ulcers.  5. COPD.  6. Hypertension.  7. Hypothyroid.  8. History of MRSA infection of lower extremity ulcers.  9. Gout.  10.Diverticulitis with chronic constipation.  11.Lumbar spine degenerative joint disease.   HOME MEDICATIONS:  1. NovoLog 70/30, Maldonado Maldonado uses between 30 and 50 units twice a day.      He checks his blood sugars 4 times a day and administers more 70/30      if he feels it is necessary in the midday.  2. Synthroid 200 mcg daily.  3. Benicar 40 mg daily.  4. Herbalife diet supplements.   SOCIAL HISTORY:  The patient lives in [place] with his wife.  He is a  retired Product manager.  He has a 15-pack-year history of cigarette smoking.  He quit approximately 30 years ago.  He does not drink alcohol.  He  uses  no drugs.  He walks 2-3 times per week.  He maintains a healthy diet.   FAMILY MEDICAL HISTORY:  Significant for heart failure in his mother who  passed away at age 36 and heart failure in his father who passed away at  age 59.  Maldonado Maldonado reports that both of his parents drank and smoked  heavily.   REVIEW OF SYSTEMS:  CONSTITUTIONAL:  Negative for fevers or chills.  Positive for 40-pound weight loss over 5 months, intentional.  HEENT:  No headache, no changes in vision.  CARDIOPULMONARY:  Positive for chest  pain, positive for one instance of shortness of breath on exertion.  Negative for palpitations, syncope, cough, wheezing, orthopnea, or PND.  NEURO/PSYCH:  Negative for weakness, positive for numbness in both lower  extremities.  GI:  Negative for nausea, vomiting, or diarrhea.  Negative  for melena, negative for symptoms of reflux.   PHYSICAL EXAMINATION:  VITAL SIGNS:  Temperature is 98.1, pulse is 50-60  and regular, respiratory rate is 12-22, blood pressure 112-143/59-88,  and oxygen saturation 95% on room air.  GENERAL:  No acute distress.  HEENT:  Head is normocephalic.   Pupils are equal, round, and reactive.  Extraocular motion is intact.  Sclerae are clear.  Mucous membranes are  dry.  Dentition is poor.  NECK:  Supple with no bruits, no JVD, no lymphadenopathy.  CARDIOVASCULAR:  Regular rate and rhythm.  S1, S2.  No murmurs, rubs, or  gallops.  CHEST:  Tender to palpation of the sternum in bilateral chest wall.  Lungs are clear to auscultation bilaterally.  SKIN:  Positive for bilateral lower extremity scarring, well healed.  There is a small 0.5 cm x 0.5 cm dried scab on the bottom of the left  foot.  This appears to be a well-healed ulcer.  ABDOMEN:  Soft, nontender, with normal bowel sounds.  EXTREMITIES:  There is no clubbing, cyanosis, or edema.  Pulses are 2+.  NEUROLOGIC:  The patient is alert and oriented x3.  Cranial nerves are  intact.  Strength is 5/5 in all extremities.  Sensation decreased from  the knees down bilateral lower extremities, otherwise sensation is  intact and equal.   RADIOLOGIC DATA:  Chest x-ray shows no active disease.  EKG shows a rate  of 57.  There is T-wave flattening in leads I, aVL as well as V5 and V6,  these are not significantly changed from previous EKGs.   LABS  White blood cells 9.1, hemoglobin 14.8, hematocrit 42.3, platelets 140.  Sodium 137, potassium 4.0, chloride 103, bicarb 25, BUN 21, creatinine  1.01, glucose 119, INR is 1, BNP is 42, point-of-care markers are  negative.  UA positive for 30 protein.   ASSESSMENT:  1. Atypical chest pain.  2. Nonobstructive coronary artery disease by cath in 2005.  3. Peripheral vascular disease.  4. Longstanding diabetes, retinopathy, neuropathy, and nephropathy.  5. History of hypothyroid.  6. Obstructive sleep apnea.   PLAN:  1. Admit for 23-hour observation.  2. Check serial cardiac enzymes and EKGs.  3. Anticipate early discharge with stress Myoview in the outpatient      setting if enzymes continued to be negative.  4. We will check fasting lipid  panel, hemoglobin A1c, and TSH for      further risk stratification.      Troy Showers, MD  Electronically Signed      Maldonado Canning. Ladona Ridgel, MD  Electronically Signed    [name]/MEDQ  D:  07/25/2008  T:  07/26/2008  Job:  [id number]

## 2010-10-28 NOTE — Cardiovascular Report (Signed)
NAME:  [name], [name] NO.:  [id number]   MEDICAL RECORD NO.:  [id number]          PATIENT TYPE:  OBV   LOCATION:  4709                         FACILITY:  [facility]   PHYSICIAN:  Rollene Rotunda, MD, FACCDATE OF BIRTH:  October 05, 1945   DATE OF PROCEDURE:  07/26/2008  DATE OF DISCHARGE:  07/26/2008                            CARDIAC CATHETERIZATION   PRIMARY CARE PHYSICIAN:  Barry Dienes. Eloise Harman, MD   PROCEDURE:  Left heart catheterization/coronary arteriography.   INDICATIONS:  Evaluate the patient with chest pain.   PROCEDURE NOTE:  Left heart catheterization was performed via the right  femoral artery.  The artery was cannulated using the anterior wall  puncture.  A #6-French arterial sheath was inserted via the Seldinger  technique.  Preformed Judkins and pigtail catheter were utilized.  The  patient tolerated the procedure well and left the Lab in stable  condition.   RESULTS:  Hemodynamics:  LV 174/10, AO 170/82.  Coronaries:  Left main  was normal.  The LAD had mid 25% long plaque.  The first diagonal was  moderate sized and normal.  The second diagonal was moderate sized and  normal.  Circumflex was large.  In the AV groove, there were diffuse 25%  lesions.  First obtuse marginal was moderate sized and branching.  The  second obtuse marginal was large and normal.  Posterolateral was large  with 30% proximal stenosis.  The right coronary artery was dominant.  There were diffuse luminal irregularities.  PDA was moderate-sized and  normal.  Left ventriculogram was obtained in the RAO projection.  The EF  was 65% with normal wall motion.   CONCLUSION:  Nonobstructive coronary artery disease.   PLAN:  No further cardiac workup is suggested.  I have discussed this  with Dr. Eloise Harman.  We will start the patient on PPI and he will  consider further GI evaluation.  Of note, the patient also has a very  low LDL and HDL.  He has been on a herbal life diet and has lost  quite a  bit of weight and wonder this might be related.  Also, of note, his TSH  is low.  I have discussed this again with Dr. Eloise Harman and the plan is  to have him hold his Synthroid on Sundays.  He should see Dr. Eloise Harman  in about 2 weeks.      Rollene Rotunda, MD, [facility]  Electronically Signed     JH/MEDQ  D:  07/26/2008  T:  07/26/2008  Job:  [id number]   cc:   Barry Dienes. Eloise Harman, M.D.

## 2010-10-31 NOTE — Consult Note (Signed)
NAME:  [name], [name]                         ACCOUNT NO.:  [id number]   MEDICAL RECORD NO.:  [id number]                   PATIENT TYPE:  INP   LOCATION:  3308                                 FACILITY:  [facility]   PHYSICIAN:  Leighton Roach. Truett Perna, M.D.              DATE OF BIRTH:  02-Sep-1945   DATE OF CONSULTATION:  05/23/2002  DATE OF DISCHARGE:  05/25/2002                                   CONSULTATION   REASON FOR CONSULTATION:  Thrombocytopenia.   REFERRING PHYSICIAN:  Duke Salvia, M.D. [facility]   HISTORY OF PRESENT ILLNESS:  [name] is a 65 year old gentleman with a  history of diabetes mellitus, hypertension, hypothyroidism, who was admitted  on May 21, 2002, with progressive dyspnea.  Admission vital signs showed  him to have a blood pressure of 194/112; chest x-ray was suggestive of CHF;  lab work showed hemoglobin 18.9, MCV 96, white blood cell count 5.7,  platelet count 66,000, sodium 137, potassium 5.1, chloride 94, CO2 38,  glucose 283, BUN 28, creatinine 1.5, total bilirubin 1.9, alkaline  phosphatase 87, SGOT 54, SGPT 55, total protein 6.5, albumin 3.8, calcium  9.1, elevated CK-MB, normal troponin and BNP 422.  A 2-D echo on May 22, 2002, showed left ventricular ejection fraction of 55-65%.  The patient was  evaluated by Dr. Shelle Iron and was felt to have classic obstructive sleep  apnea/obese hyperventilation syndrome.  His increased hemoglobin was felt to  be secondary to hypoxia.  Hematology consult was requested to further  evaluate thrombocytopenia.   PAST MEDICAL HISTORY:  1. Diabetes mellitus.  2. Hypothyroidism.  3. Hypertension.  4. Hyperlipidemia.  5. Obstructive sleep apnea/obese hyperventilation syndrome.  6. Cor pulmonale and chronic hypoxemia.  7. Back surgery 1978 and 1979.   CURRENT MEDICATIONS:  1. Lasix 80 mg twice daily.  2. 70/30 insulin 8 units every morning.  3. Vasotec 5 mg every 12 hours.  4. Lovenox 40 mg daily.  5. Synthroid 50  mcg daily.  6. Nitroglycerin drip.  7. Sliding scale insulin.   HOME MEDICATIONS:  Synthroid, insulin, cod liver oil, multivitamin, K-Dur,  and vitamin C.   ALLERGIES:  No known drug allergies.   FAMILY HISTORY:  Mother deceased of an MI; father deceased of an MI; brother  with diabetes mellitus; brother healthy.   SOCIAL HISTORY:  [name] lives in [place].  He is married.  He has one  son, age 53, and one daughter age 34.  Both of his children are healthy.  He  is retired from [employer].  He reports a positive tobacco history stating that  he quit smoking 25 years ago.  He reports rare ETOH intake.   REVIEW OF SYSTEMS:  GENERAL:  The patient reports recent weight gain.  He  denies any fever or night sweats.  His appetite has been poor.  He denies  any pain.  His energy has  been poor.  HEENT:  He denies any unusual  headaches.  He has noticed some swelling around his eyes.  RESPIRATORY:  He  has become increasingly short of breath and is having occasional cough.  He  denies any hemoptysis.  CARDIOVASCULAR:  He has had no chest pain.  MUSCULOSKELETAL:  He reports increasing lower extremity edema.  GI:  He has  been alternating constipation and diarrhea for the past month.  He denies  any rectal bleeding.  GU:  He has had no hematuria or dysuria.  NEUROLOGIC:  He reports his legs to be numb.   PHYSICAL EXAMINATION:  VITAL SIGNS:  Temperature 94.4 (axillary), heart rate  88, respirations 24, blood pressure 140/56, oxygen saturation 93% on a face  mask.  GENERAL:  Pleasant Caucasian male in no acute distress.  HEENT:  Normocephalic, atraumatic.  Periorbital edema present.  Pupils  equal, round, and reactive to light; extraocular movements are intact;  sclerae anicteric.  Posterior palate is erythematous.  Upper dentures  present.  LYMPH:  No palpable cervical, supraclavicular, axillary, or inguinal lymph  nodes.  CHEST:  Lungs clear bilaterally.  CARDIOVASCULAR:  Regular rhythm  and rate; no gallop.  ABDOMEN:  Obese; distended.  Bowel sounds active.  No palpable  hepatosplenomegaly.  EXTREMITIES:  There is 2-3+ lower extremity edema with chronic stasis  changes bilateral lower legs.  No clubbing.  NEUROLOGIC:  Alert and oriented.  Moves all extremities.   LABORATORY DATA:  Hemoglobin 18.9, white count 5.7, platelets 66,000.  Sodium 138, potassium 3.4, BUN 23, creatinine 1.3, glucose 267, calcium 8.7.  PT 15.6, INR 1.3, PTT 37.   Admission labs showed hemoglobin 18.9, white count 5.7, platelets 66,000.  Sodium 137, potassium 5.1, BUN 28, creatinine 1.5, glucose 283, total  bilirubin 1.9, alkaline phosphatase 87, SGOT 54, SGPT 55, total protein 6.5,  albumin 3.8, calcium 9.1.   Labs July 2001:  Hemoglobin 15.5, white count 9.2, platelets 194,000, MCV  92.   Peripheral blood smear:  Thick, and difficult to interpret.  White blood  cells appear normal.  We will request a repeat blood smear.   RADIOLOGY:  Chest x-ray:  CHF.   IMPRESSION:  Problem 1.  Diabetes mellitus.  Problem 2.  Hypertension.  Problem 3.  Obstructive sleep apnea/obesity hyperventilation syndrome.  Problem 4.  Congestive heart failure.  Problem 5.  Chronic hypoxemia.  Problem 6.  Erythrocytosis.  Problem 7.  Thrombocytopenia.  Problem 8.  Hypothyroid.   The erythrocytosis on admission is very likely due to chronic hypoxia  causing a secondary erythrocytosis.  We have a low suspicion for a  myeloproliferative disorder.  He has a moderate degree of thrombocytopenia  (question chronicity).  This could be related to congestive  hepatosplenomegaly from severe right-sided heart failure.  The differential  diagnosis also includes chronic idiopathic thrombocytopenic purpura,  myelodysplasia.  There is no evidence for an acute systemic condition  causing platelet consumption, i.e. DIC/TTP.  It will be helpful to find old  CBC data from Dr. Noah Charon office.  RECOMMENDATIONS:  Number 1.  Medical  therapy for obstructive sleep  apnea/congestive heart failure.  Number 2.  Repeat blood smear today.  Number 3.  Obtain remote CBC data.  Number 4.  Consider abdominal ultrasound to evaluate for splenomegaly.   We will continue to follow with you while hospitalized and then as an  outpatient.   The patient seen and examined by Dr. Truett Perna; chart reviewed.     Lonna Cobb, N.P.  Leighton Roach Truett Perna, M.D.    LT/MEDQ  D:  05/26/2002  T:  05/26/2002  Job:  [id number]   cc:   Carolynn Serve  [address]  [place]  [place] [address]  Fax: [phone]   Duke Salvia, M.D. [facility]

## 2010-10-31 NOTE — Assessment & Plan Note (Signed)
[facility]   NAME:  [name], [name]                ACCOUNT NO.:  [id number]   MEDICAL RECORD NO.:  [id number]            DATE OF BIRTH:   PHYSICIAN:  Jonelle Sports. Sevier, M.D.  VISIT DATE:  12/04/2005                                     OFFICE VISIT   HISTORY:  This 65 year old male has been followed for extensive lower  extremity ulcerations which began at the time of the staphylococcal  (methicillin-resistant Staphylococcus aureus), septicemia and which have  been very hemorrhagic in nature and slow to heal, despite extended  antibiotic therapy and aggressive local measures.   Happily, over the past few weeks to months, his wounds have gradually  progressed and he says today that they look the best today that they have at  any point since we initiated therapy.  There has been no new pain in these  areas.  There continues to be he says the usual amount of serosanguineous  drainage.  There is no odor.  There are no fevers or systemic symptoms.  He  has noted no increase in swelling and he has had no change in  his usual  medications since his last visit.   PHYSICAL EXAMINATION:  EXTREMITIES:  Both lower extremities are involved in  their distal pre-tibial locations with erythema, weeping, low-grade bleeding  with incrustation and punctate open ulcerations.   Although the right lower extremity had been declared resolved at the last  visit, it seems to me that it probably is not truly resolved.   The left lower extremity has considerable changes, similar to the right.  These are just lateral to the midline and measure over an area of about 8.9  cm x 3.8 cm x 0.2 cm.  Again, there is lots of desquamation of skin.  There  is considerable serosanguineous weeping.  There is some incrustation with  old blood, but no significant odor and less surrounding erythema than had  previously been present.  VITAL SIGNS:  Blood pressure 132/74, pulse 64, respirations 18,  temperature  98.2 degrees.   IMPRESSION:  Slow but continued improvement in bilateral lower extremity  stasis wounds.   DISPOSITION:  Both lower extremities are carefully debrided of the bloody  crusts and some of the desquamated skin, and this is well-tolerated,  although some new bleeding is generated in a couple of locations on both  extremities.  These are addressed for the first time with an application of  a tiny bit of Gelfoam, to see if perhaps this will cut down on the bleeding  between visits.  The areas were then treated with an application of Nystatin  cream, covered with soft sterile pads and both extremities are placed in  peripheral wraps from the toes to the knees, to counter his venous  hypertension and his tendency towards edema.   His next visit here will be on December 15, 2005.           ______________________________  Jonelle Sports. Cheryll Cockayne, M.D.    RES/MEDQ  D:  12/04/2005  T:  12/04/2005  Job:  [id number]

## 2010-10-31 NOTE — Consult Note (Signed)
NAME:  [name], [name]                         ACCOUNT NO.:  [id number]   MEDICAL RECORD NO.:  [id number]                   PATIENT TYPE:  INP   LOCATION:  3308                                 FACILITY:  [facility]   PHYSICIAN:  Barry Dienes. Eloise Harman, M.D.            DATE OF BIRTH:  1946-01-05   DATE OF CONSULTATION:  DATE OF DISCHARGE:                                   CONSULTATION   REQUESTING PHYSICIAN:  Duke Salvia, M.D.   REASON FOR CONSULTATION:  Management of uncontrolled diabetes, Type II, and  hypothyroidism.   HISTORY OF PRESENT ILLNESS:  The patient is a 65 year old white male who was  admitted by Dr. Graciela Husbands with acute congestive heart failure.  He has had  bilateral leg edema for approximately one month and progressively worsening  dyspnea over the past week.  He has not had fever, chills or productive  cough.  He presented to the [facility] emergency room yesterday and was  admitted with congestive heart failure.  At this time his dyspnea is still  moderate although much improved from the time of admission.  He has not had  any chest discomfort.   PAST MEDICAL HISTORY:  His past medical history is significant for diabetes  mellitus Type II for approximately 12 years for which he is on insulin.  He  also has diabetic peripheral neuropathy.  He has no history of coronary  artery disease or congestive heart failure.  He has chronic low-back pain,  hypothyroidism and likely obstructive sleep apnea by recent hypoxia with  polycythemia.   MEDICATIONS:  Medications prior to admission included:  1. Synthroid 50 mcg p.o. q.d.  2. NPH insulin 20-30 units subcutaneous twice daily; and,  3. A multivitamin 1 tablet daily.   ALLERGIES:  PENICILLIN.   PAST SURGICAL HISTORY:  1. In 1978 lumbar spine surgery.  2. In 1980 repeat lumbar spine surgery.   FAMILY HISTORY:  Father died at age 69 of coronary artery disease.  Mother  died at age 23 of coronary artery disease and  congestive heart failure.  He  has two brothers, one of which has diabetes mellitus.  There is no family  history of premature coronary artery disease or colon cancer.   SOCIAL HISTORY:  He has been married for 38 years and has two children; a  daughter age 27 and a son age 38.  He has four grandchildren.  He has no  history of tobacco or alcohol abuse.   REVIEW OF SYMPTOMS:  Significant for moderate dyspnea with walking to the  bathroom in his room and chronic blurry vision bilaterally.  He has chronic  mild constipation without rectal bleeding and chronic numbness of both feet  and hands.  He denies headache, nausea or symptoms of benign prostatic  hypertrophy.   PHYSICAL EXAMINATION:  VITAL SIGNS:  Blood pressure 150/78, pulse 56,  respiratory rare 20, temperature 97.4 and oxygen  saturation 97% on 3  liters/minute.  IN GENERAL:  He is an overweight white male who is in no apparent distress.  HEENT EXAMINATION:  Significant for bilateral scleral injection.  NECK:  Supple and without jugular venous distention or carotid bruit.  CHEST:  Has bilateral decreased breath sounds at the bases.  HEART:  Has a regular rate and rhythm.  S1 and S2 are present without  murmur, gallop or rub.  ABDOMEN:  Has normal bowel sounds with no hepatosplenomegaly or tenderness.  EXTREMITIES:  Have bilateral 2+ pitting edema to the knees.  There is a  small callous in the mid plantar surface of the left foot overlying the  third metatarsal head.  NEUROLOGIC EXAMINATION:  He is alert and oriented times three.  His exam is  significant for decreased light touch sensation to both feet.   LABORATORY STUDIES:  White blood cell count 5.7, hemoglobin 18, hematocrit  57 and platelet count 66,000.  Serum sodium 138, potassium 3.4, chloride 90,  CO2 40, BUN 23, creatinine 1.3, and glucose 337.  Capillary blood glucose  levels 221 and 219.  Serum CK level 337 with MB 12.5.  a B natriuretic  peptic level was 422.   Chest x-ray was consistent with congestive heart  failure.   IMPRESSION AND PLAN:  1. Diabetes mellitus Type II.  This is uncontrolled on his current regimen with only longacting insulin.  He needs some rapid active insulin to cover mealtime glucose excursions.  He  also needs increased education about diabetes and insulin treatment.  At  this time for simplicity I will continue his transition to 70/30 insulin  twice daily.  His 70/30 dose will gradually be titrated to a safe level  prior to discharge.  1. Congestive heart failure.  His symptoms have improved significantly since admission.  He likely has  significant coronary artery disease, underlying, and may have had a non-Q  wave myocardial infarction.  Defer further workup to his cardiologist.  1. Fatigue.  This is most likely due to obstructive sleep apnea given his high hematocrit  and high serum carbon dioxide level.   It is a pleasure participating in the care of this gentleman and I look  forward to coordinating his extensive outpatient primary care needs.                                                 Barry Dienes Eloise Harman, M.D.    DGP/MEDQ  D:  05/22/2002  T:  05/22/2002  Job:  [id number]

## 2010-10-31 NOTE — Assessment & Plan Note (Signed)
[facility]   NAME:  Troy, Maldonado               ACCOUNT NO.:  [id number]   MEDICAL RECORD NO.:  [id number]            DATE OF BIRTH:   PHYSICIAN:  Theresia Majors. Tanda Rockers, M.D. VISIT DATE:  11/05/2005                                     OFFICE VISIT   CLINIC NOTE - Nov 05, 2005.   HISTORY OF PRESENT ILLNESS:  [name] is a 65 year old man who has been  followed for several months for bilateral stasis ulcers.  During the interim  he has worn Una boots with decrease in swelling and drainage.  He denies  fever.   PHYSICAL EXAMINATION:  VITAL SIGNS:  His vital signs are stable.  He is  afebrile.  EXTREMITIES:  The stasis ulcers are bilaterally covered with a varying  amount of desquamated skin and they both are highly friable.   ASSESSMENT:  Minimum improvement.   PLAN:  The wounds were debrided of nonviable tissue, i.e. full thickness.  Topical Nystatin was placed in bilateral Profore wraps were placed to  control the persistent edema.  We will see the patient in one week for re-  evaluation.           ______________________________  Theresia Majors Tanda Rockers, M.D.     [name]  D:  11/05/2005  T:  11/06/2005  Job:  [id number]
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
## 2010-10-31 NOTE — Op Note (Signed)
NAME:  [name], [name]                         ACCOUNT NO.:  [id number]   MEDICAL RECORD NO.:  [id number]                   PATIENT TYPE:  OBV   LOCATION:  0373                                 FACILITY:  [facility]   PHYSICIAN:  Sigmund I. Patsi Sears, M.D.         DATE OF BIRTH:  [birth year]   DATE OF PROCEDURE:  04/09/2003  DATE OF DISCHARGE:  04/10/2003                                 OPERATIVE REPORT   PREOPERATIVE DIAGNOSIS:  Organic erectile dysfunction.   POSTOPERATIVE DIAGNOSIS:  Organic erectile dysfunction.   PROCEDURE:  Placement of inflatable penile prosthesis.   SURGEON:  Sigmund I. Patsi Sears, M.D.   ASSISTANT:  1. Lindaann Slough, M.D.  2. Susanne Borders, MD   ANESTHESIA:  General endotracheal, local anesthesia to skin wound.   ESTIMATED BLOOD LOSS:  250 mL.   COMPLICATIONS:  None.   DRAINS:  An 49 French Foley catheter to straight drain.   DISPOSITION:  To post anesthesia care unit in stable condition.   INDICATIONS FOR PROCEDURE:  Ms. Fester is a 65 year old white male followed  by Dr. Patsi Sears for his erectile dysfunction. The patient has long  standing history of diabetes and has not had satisfaction to oral agents to  assist in his erectile function. Other various treatment options have been  discussed with the patient and the patient requests placement of a penile  prosthetic device. He consented to his after understanding the risks,  benefits, and alternatives.   DESCRIPTION OF PROCEDURE:  The patient was brought to the operating room and  correctly identified by his identification bracelet. He was given  preoperative antibiotics of Ancef and gentamycin. He was shaved in typical  fashion and given a 10 minute Betadine scrub to his genitalia prior to being  prepped and draped in typical sterile fashion. An approximately 4 cm  transverse penoscrotal incision was made. Bovie electrocautery was used to  incise the layers of the scrotum in the dartos fascia  down to the corpora  cavernosa. The tissue overlying the corpora was dissected freely using  Metzenbaum scissors exposing the tunica of the corpora bilaterally. In both  corpora, two stay sutures of 2-0 Vicryl were placed. Corporotomy incisions  were made with a 15 blade knife between the two stay sutures on both sides.  Dilation of the corpora was performed bilaterally with sequential dilators  up to 14 mm on each side both proximal and distally. Corporal measurements  were then taken. Initial measurements were 21 cm bilaterally. Attempt was  made for placement of an 18 cm device with 3 cm rear tip extenders, however,  this would not seat properly on either side. An attempt was made with a 2 cm  rear tip extender and again proper seatment could not be obtained; however,  using an 18 cm cylinders bilaterally with 1 cm rear tip extenders proper  fitting was accomplished. Placement of the cylinders consisted of passage of  the [facility] tool  in the distal corporotomy on both sides. The cylinders had  been attached to the [place] needle with a preplaced pull string. The [place]gina Evener  tool was placed in the corporotomy incision and passed out to the lateral  aspect of the glans away from the urethra. The [place]gina Evener tool was fired pushing  the [place] needle through the glans bilaterally. Extreme care was taken not  to fire the needle through the urethra. There was no evidence of urethral  trauma after passage of both cylinders. The 1 cm rear tip extenders were  attached to the proximal aspect of the cylinder on both sides and the  cylinders were pushed into the proximal corporotomy incision. The device was  cycled with an 18 cm plus 1 cm rear tip extenders and very good fit was  obtained with adequate erection that was straight and suitable for  intercourse. At this point, the corporotomy incisions were closed  bilaterally with a running 2-0 Vicryl. A dartos pouch was created for the  pump in the right  hemiscrotum as the patient is right handed. This was done  bluntly with surgeon's finger and the pump could be placed into a dependent  part of the right hemiscrotum without complications. Next, space for the  reservoir was created first bluntly by using surgeon's finger to clear the  dartos fascia from the inguinal canal on the right side. The fascia  overlying the pubis could be palpated with surgeon's finger and a Metzenbaum  scissor was used to make a small opening in this fascia such as surgeon's  finger could be passed through it. Prior to this maneuver, the bladder was  completely drained through the Foley catheter to avoid damage to the  bladder. The pump was then carefully passed into the space of Retzius that  had been created. The pump was filled with 65 mL of water and there was no  back pressure in the syringe. Next the connections were made between the  pump and the cylinders and the reservoir. All the air was flushed out of the  tubing prior to connections being made. The tubing was then buried under  dartos fascia in the scrotum. The pump was also covered with a layer of  dartos fascia using a 3-0 chromic. The penoscrotal incision was then closed  in two layers using 3-0 and 4-0 chromic for the dartos layer and the skin  layer respectively. These were done in a running fashion. Copious amounts of  antibacterial irrigation were used throughout the case. The Foley catheter  was connected to a drainage bag at the end of the case and clear urine was  obtained. The device was left with minimal fluid in the cylinders to help  tamponade any bleeding in the corpora. Please note that Dr. Patsi Sears was  present and participated in all aspects of this case as he was the primary  surgeon. There were complications.     Susanne Borders, MD                           Sigmund I. Patsi Sears, M.D.   DR/MEDQ  D:  04/10/2003  T:  04/10/2003  Job:  [id number]
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
## 2010-10-31 NOTE — Assessment & Plan Note (Signed)
[facility]   NAME:  [name], [name]               ACCOUNT NO.:  [id number]   MEDICAL RECORD NO.:  [id number]            DATE OF BIRTH:   PHYSICIAN:  Theresia Majors. Tanda Rockers, M.D. VISIT DATE:  02/04/2006                                     OFFICE VISIT   SUBJECTIVE:  [name] is a 65 year old man who has been followed with  bilateral ulcerations in his lower extremity.  We managed him as stasis  ulcers.  There is a significant inflammatory reaction that has been  persistent throughout his management.  During the interim, he has denied  fever or excessive drainage or malodor.   OBJECTIVE:  Blood pressure is 120/70, pulse rate of 72, respirations 22, and  he is afebrile.  Exam of the lower extremity shows that the large ulcers on  the lateral aspects of both lower extremities are persistent.  There are  heavily desquamated areas which have been debrided disclosing superficial  ulcers beneath.  These ulcers were debrided with a 10 blade with hemorrhage  control with direct pressure.   ASSESSMENT:  Persistent inflammatory/stasis ulcers.   PLAN:  We are starting the patient on a trial of Protopic ointment.  We will  continue the Profore wraps for compression.  We will reevaluate the patient  in five days to re-assess his response to this therapy.  We have explained  the use of this antimetabolite as an anti-Inflammatory agent in this local  tissue in terms that the patient seems to understand.  He is agreeable.  We  will reevaluate him at the appointed time.           ______________________________  Theresia Majors Tanda Rockers, M.D.     [name]  D:  02/04/2006  T:  02/04/2006  Job:  [id number]

## 2010-10-31 NOTE — Assessment & Plan Note (Signed)
[facility]   NAME:  [name], [name]                ACCOUNT NO.:  [id number]   MEDICAL RECORD NO.:  [id number]          DATE OF BIRTH:   PHYSICIAN:  Theresia Majors. Tanda Rockers, M.D. VISIT DATE:  10/26/2005                                     OFFICE VISIT   HISTORY OF PRESENT ILLNESS:  [name] is a 65 year old man who we have  been following for bilateral ulcerations of the lower extremity.  The  patient is a diabetic.  He has grown multiple cultures which have shown skin  flora, and recently he had a culture showing Candida.  He has been treated  with topical Nystatin and compression wraps.   OBJECTIVE:  VITAL SIGNS:  Stable.  Blood pressure is 164/94, 98.4,  respirations 12, and pulse rate of 56.  EXTREMITIES:  The wounds of the lower extremity appear to be slightly  improved with progressive epithelialization at all levels.  There remains  hyperpigmentation and moderate _________________.  There is persistent 2 to  3+ edema.   IMPRESSION:  Improved.   PLAN:  We will continue Nystatin ointment and bilateral Profore wraps.  We  will see the patient in one week.           ______________________________  Theresia Majors. Tanda Rockers, M.D.     [name]  D:  10/26/2005  T:  10/26/2005  Job:  [id number]

## 2010-10-31 NOTE — Cardiovascular Report (Signed)
NAME:  Troy, Maldonado                         ACCOUNT NO.:  [id number]   MEDICAL RECORD NO.:  [id number]                   PATIENT TYPE:  OIB   LOCATION:  6598                                 FACILITY:  [facility]   PHYSICIAN:  Arturo Morton. Riley Kill, M.D. [facility]         DATE OF BIRTH:  [birth year]   DATE OF PROCEDURE:  12/21/2003  DATE OF DISCHARGE:                              CARDIAC CATHETERIZATION   PROCEDURES PERFORMED:  1. Left heart catheterization.  2. Selective coronary arteriography.  3. Selective left ventriculography.   CARDIOLOGIST:  Arturo Morton. Riley Kill, M.D.   INDICATIONS:  [name] is a 65 year old gentleman who presents with  shortness of breath and abnormal Cardiolite study.  The current study was  done to assess coronary anatomy following an abnormal Cardiolite.  Risks,  benefits and alternatives were discussed with the patient.   DESCRIPTION OF PROCEDURE:  The procedure was performed from the right  femoral artery using 6 French catheters.  The patient was significantly  hypertensive.  Intravenous Vasotec as well as intravenous labetalol was  administered to try to bring blood pressure during the course of the  procedure.   The patient tolerated the procedures without complications and was taken to  the holding area in satisfactory clinical condition.   HEMODYNAMIC DATA:  Central aorta 212/98.  Left ventricle 200/19.  No  gradient on pullback across the aortic valve.   ANGIOGRAPHIC DATA:  1. Ventriculography:  Ventriculography was performed in the RAO projection.     Overall systolic function was preserved.  No segmental abnormalities or     contractions were identified.  Opacification was suboptimal for     calculation of the ejection fraction.   1. Right Coronary Artery:  The right coronary artery was a large caliber     vessel providing an acute marginal, posterior descending and     posterolateral system.  In the proximal portion of the midvessel there     was  an eccentric plaque that measured about 30% luminal reduction.  This     does not appear to be high-grade or flow-limiting.   1. Left Main Coronary Artery:  The left main coronary artery is free of     critical disease.   1. Left Anterior Descending Artery:  The left anterior descending artery     courses to the apex. There are two smaller diagonal branches.  The apical     vessel wraps the apex and supplies the distalmost portion of the inferior     wall.  Other than minor luminal irregularity no significant areas of     focal stenosis were identified.   1. Circumflex:  The circumflex provides a tiny first marginal, moderate     second marginal, and large third and fourth marginal branches.  The third     and fourth marginal branches wrap the lateral apex.  The circumflex     vessel appears to be free of  significant disease.   CONCLUSION:  1. Well-preserved left ventricular function.  2. Poorly controlled systemic hypertension.  3. Mild eccentric plaquing of the mid right coronary artery.  4. Minor luminal irregularities as noted above.   DISPOSITION:  1. Control of blood pressure.  2. Suggestion of weight loss and treatment of metabolic syndrome.  3. Follow up with Dr. Jerral Bonito.                                               Arturo Morton. Riley Kill, M.D. [facility]    TDS/MEDQ  D:  12/21/2003  T:  12/22/2003  Job:  [id number]   cc:   CV Laboratory   Duke Salvia, M.D.   Willa Rough, M.D.

## 2010-10-31 NOTE — Discharge Summary (Signed)
[name], [name] NO.:  [id number]   MEDICAL RECORD NO.:  [id number]          PATIENT TYPE:  INP   LOCATION:  6727                         FACILITY:  [facility]   PHYSICIAN:  Barry Dienes. Eloise Harman, M.D.DATE OF BIRTH:  July 26, 1945   DATE OF ADMISSION:  05/10/2006  DATE OF DISCHARGE:  05/14/2006                               DISCHARGE SUMMARY   PERTINENT FINDINGS:  The patient is a 64 year old white male with  multiple medical problems.  For the 3-4 days prior to admission, he had  malaise with decreased appetite, worsening fatigue, and elevated blood  glucose levels in the 200 range fasting.  Although he had not had fever,  he had shaking rigors approximately 3 days prior to admission.  This was  associated with worsening of the left forefoot ulcer that developed  increasing erythema and serosanguineous drainage.  He also had  increasing pain in the left foot that interfered with his ability to  sleep.  He also complained of mild worsening of his chronic dyspnea on  exertion, but denied substernal chest pain, palpitations, diarrhea,  abdominal pain, dysuria or frequency, headaches, or worsening anxiety.   PAST MEDICAL HISTORY:  1. Diabetes mellitus, type 2, complicated by retinopathy, nephropathy,      and peripheral neuropathy.  2. Obstructive sleep apnea that is severe, for which he uses CPAP      q.h.s.  3. Hypertension.  4. Congestive heart failure due to diastolic dysfunction.  5. Nonobstructive coronary artery disease by a 2005 cardiac      catheterization.  6. 2004 - diverticulitis.  7. Right foot ulcer in 2000.  8. Glaucoma.  9. December 2005 - bilateral hidradenitis requiring surgery with      cultures revealing MRSA.   MEDICATIONS PRIOR TO ADMISSION:  1. Synthroid 300 mcg every Monday, Wednesday, and Friday, and 200 mcg      on other days.  2. CPAP q.h.s.  3. Multivitamin 1 tablet daily.  4. Benicar 20 mg daily.  5. Nexium 40 mg daily.  6. NovoLog  70/30, 50 units subcutaneous twice daily.  7. Fluoxetine 40 mg daily.  8. Nitroglycerin 0.4 mg sublingual p.r.n. chest pain.  9. MiraLax 17 grams daily.   ALLERGIES:  PENICILLIN.   INITIAL PHYSICAL EXAMINATION:  VITAL SIGNS:  Blood pressure 130/90,  pulse 64, respirations 30, temperature 98.7, weight 263 pounds which was  a 5 pound increase from April 16, 2006.  GENERAL:  He was a mildly overweight white male who is in no apparent  distress.  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  NECK:  Supple without jugular venous distension or carotid bruit.  CHEST:  Clear to auscultation.  HEART:  A regular rate and rhythm with a systolic ejection murmur of  grade 1/6 at the left sternal border.  ABDOMEN:  Normal bowel sounds and no hepatosplenomegaly or tenderness.  EXTREMITIES:  Left 1+ pitting edema with warmth and erythema in the  distal one-third of the leg and the distal and medial aspect of the left  foot.  The left foot dorsum had  a several centimeter wide area of heavy  callus buildup with cracks in it and was oozing serosanguineous fluid.  There was no obvious purulence.  Both feet had decreased light touch  sensation.  Both pedal pulses were 1+.  NEUROLOGIC:  Alert and oriented x3 with normal affect.  Cranial nerves  II-XII were normal.  He had decreased light touch sensation of both  feet.  SKIN:  The skin exam was also notable for bilateral hyperpigmentation of  the distal legs, consistent with chronic venous insufficiency.   LABORATORY DATA:  Initial laboratory studies revealed serum sodium 137,  potassium 4.1, chloride 100, carbon dioxide 31, BUN 23, creatinine 1.2,  glucose 185.  White blood cell count 9.2, hemoglobin 11, hematocrit 33,  platelets 137, total protein 6.9, albumin 3.3, BNP 194.   CHEST X-RAY:  Cardiomegaly with bibasilar scarring.   HOSPITAL COURSE:  The patient was admitted to a medical bed without  telemetry.  He was empirically  started on vancomycin with Avelox for his  left foot infection, and cultures from the site were obtained.  He was  seen by his orthopedist on May 11, 2006, who, at that time, did a  bedside wound excision and drainage, during which cultures were  obtained.  An MRI of the left foot was also obtained with contrast that  showed cellulitis deep to the plantar forefoot ulcer with no evidence of  soft tissue abscess or osteomyelitis.  There was nonspecific  subcutaneous edema over the dorsum of the left forefoot and around the  ankle that could be due to proximal extension of the cellulitis.  The  patient continued to improve rapidly.  He had an arterial ultrasound  evaluation with ankle brachial indices of 1.14 on the right and 1.04 on  the left that were triphasic waveforms bilaterally.  His left foot ulcer  wound grew out a few Staphylococcus aureus that was methicillin-  sensitive Staphylococcus aureus, sensitive to all agents tested with the  exception of penicillin.   He had a transthoracic echocardiogram done to further assess his  congestive heart failure which showed the following:  (1) Overall normal  left ventricular systolic function with an ejection fraction of 60-65%.  There were no regional wall motion abnormalities in the left ventricle,  (2) marked asymmetric septal hypertrophy with mild systolic anterior  motion of the mitral valve.  There was mild mitral annular  calcification.  There was mild left atrial enlargement with a 41 mm left  atrial diameter.  There was mild right ventricular enlargement with  trivial tricuspid regurgitation and estimated right ventricular systolic  pressure of 33 mmHg.  The echocardiogram was consistent with  nonobstructive hypertrophic cardiomyopathy.   PROCEDURES:  Incision and drainage of the left foot ulcer.   IMPRESSION:  1. Left foot cellulitis. 2. Methicillin-sensitive Staphylococcus aureus infection of the left      foot ulcer.   3. Diabetes mellitus, type 2 with retinopathy, nephropathy, and      peripheral neuropathy.  4. Congestive heart failure due to diastolic dysfunction, chronic.  5. Hypertrophic cardiomyopathy.  6. Obstructive sleep apnea, severe.  7. Hypertension.  8. Nonobstructive coronary artery disease.  9. 2004 - diverticulitis.  10.History of 2004 right foot ulcer.  11.Glaucoma.  12.2005 - bilateral hidradenitis with methicillin-resistant      Staphylococcus aureus infection.  13.Depression.  14.Gastroesophageal reflux disease  15.Chronic constipation.  16.Hypothyroidism.  17.Chronic obstructive pulmonary disease.   DISCHARGE MEDICATIONS:  1. Benicar 20 mg p.o. daily.  2. Synthroid 200 mcg daily.  3. Multivitamin once daily.  4. Nexium 40 mg daily or Prilosec 20 mg daily.  5. Fluoxetine 40 mg daily.  6. NovoLog 70/30 mix insulin 50 units subcutaneous twice daily.  7. Nitroglycerin 0.4 mg sublingual p.r.n. chest pain.  8. MiraLax 17 grams daily.  9. Doxycycline 100 mg 1 tablet p.o. b.i.d. x12 days   SPECIAL INSTRUCTIONS:  He was advised to call Dr. Eloise Harman or Dr. Lajoyce Corners  if his left foot wound becomes more erythematous or develops purulence.  He was also advised to do Dial soap soaks of his left foot twice daily  in lukewarm water.   FOLLOW-UP PLANS:  He was advised to schedule a follow-up appointment  with Dr. Lajoyce Corners in approximately 1 week following discharge and was given  the telephone number to call for that appointment.  He was also advised  to come to his scheduled follow-up appointment with Dr. Eloise Harman at  [facility] on May 19, 2006.  A home health nurse  was asked to assist in follow-up wound care.           ______________________________  Barry Dienes. Eloise Harman, M.D.     DGP/MEDQ  D:  06/02/2006  T:  06/02/2006  Job:  [id number]   cc:   Nadara Mustard, MD  Sigmund I. Patsi Sears, M.D.  Duke Salvia, MD, [facility]  Alford [place]. Rankin, M.D.  Wilhemina Bonito. Marina Goodell, MD

## 2010-10-31 NOTE — Assessment & Plan Note (Signed)
[facility]   NAME:  [name], [name]               ACCOUNT NO.:  [id number]   MEDICAL RECORD NO.:  [id number]           DATE OF BIRTH:   PHYSICIAN:  Theresia Majors. Tanda Rockers, M.D. VISIT DATE:  01/21/2006                                     OFFICE VISIT   SUBJECTIVE:  [name] returns for follow-up of bilateral  inflammatory/stasis ulcerations.  During the interim we have treated him  with a compression wrap.  He reports no excessive drainage and no pain.   OBJECTIVE:  VITAL SIGNS:  Blood pressure is 130/80, respirations 18, pulse  rate 68 and he is afebrile.  EXTREMITIES:  The wraps were removed disclosing an area of near  hypergranulation on the right lower extremity.  The diameter of the area is  1.4 and is it is in a circular configuration.  Photographs of this area were  taken and entered into the Wound Expert.  On the left lower extremity areas  of desquamation are persistent.  There are no open draining wounds but the  wound itself is extremely moist with matting of desquamated mounds of  tissue.   ASSESSMENT:  Marginal improvement.   PLAN:  Will use a unidirectional synthetic dressing containing silver to  wick the wound dry and to add antimicrobial activity.  We will continue  external compression with a Profore.  We will reevaluate the patient in one  week to assess his response to his therapy.           ______________________________  Theresia Majors. Tanda Rockers, M.D.     [name]  D:  01/21/2006  T:  01/21/2006  Job:  [id number]

## 2010-10-31 NOTE — Assessment & Plan Note (Signed)
[facility]   NAME:  Troy, Maldonado               ACCOUNT NO.:  [id number]   MEDICAL RECORD NO.:  [id number]      DATE OF BIRTH:  [birth year]   PHYSICIAN:  Jake Shark A. Tanda Rockers, M.D. VISIT DATE:  01/28/2006                                     OFFICE VISIT   VITAL SIGNS:  Blood pressure is 130/85, respirations 20, pulse rate 70, and  he is afebrile.   PURPOSE OF TODAY'S VISIT:  [name] returns for follow up of bilateral  inflammatory ulcerations in his lower extremities.  During the interim, he  has complained of his wraps being too tight but denies excessive drainage or  fever.  He has had considerable pain and has taken Tylox, two every six to  eight hours p.r.n. for his pain.   WOUND EXAM:  Inspection of the lower extremities shows that there has been  some retrogression over the right lateral leg, with a new pustule developed  in the area of intense inflammation.  Areas of desquamation are persistent.  Full-thickness debridement was not indicated.  On the left lower extremity,  this wound is actually improved somewhat.  There is no active ulceration in  the confines of the intense inflammation.  There remains 1+ edema.  These  areas still are nontender.  Examination of the feet shows no evidence of  wrap injury.  There are, however, some increased areas of callus formation,  but these were not ___________.   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   MANAGEMENT PLAN & GOAL:  The pain that the patient has reported is likely  related to an increase in inflammation rather than his wraps being too  tight.  We have suggested that during his next visit we may do a trial of  tacrolimus ointment 0.1% on selected areas to see if there is any benefit.  We have tried in the past antifungal agents, antibacterials, and anti-  inflammatory agents.  We have not made significant progress in this wound.  The patient appreciates our frustrations at trying to effect a cure.  We  will reevaluate him in one week p.r.n.           ______________________________  Theresia Majors. Tanda Rockers, M.D.     [name]  D:  01/28/2006  T:  01/28/2006  Job:  [id number]

## 2010-10-31 NOTE — H&P (Signed)
[name], DIVITA NO.:  [id number]   MEDICAL RECORD NO.:  [id number]          PATIENT TYPE:  INP   LOCATION:  6727                         FACILITY:  [facility]   PHYSICIAN:  Barry Dienes. Eloise Harman, M.D.DATE OF BIRTH:  06/29/1945   DATE OF ADMISSION:  05/10/2006  DATE OF DISCHARGE:                                HISTORY & PHYSICAL   CHIEF COMPLAINT:  Left foot pain.   HISTORY OF PRESENT ILLNESS:  The patient is a 65 year old white male with  multiple medical problems.  For approximately three to four days, he has had  malaise with decreased appetite and worsening fatigue associated with an  acute increase in his blood glucose levels to greater than 200 fasting.  He  has not had fever, but he has had some shaking rigors approximately three  days ago.  This has been associated with worsening of a left forefoot ulcer  with a crack in the ulcer oozing some serosanguineous drainage.  He has also  had pain in the left foot that has interfered with his ability to sleep.  He  has mild worsening of his chronic dyspnea on exertion.  He denies substernal  chest pain, palpitations, constipation, diarrhea, abdominal pain, dysuria or  frequency, headaches, or worsening anxiety or depression.   PAST MEDICAL HISTORY:  Significant for:  1. Diabetes mellitus type 2 complicated by retinopathy, nephropathy and      peripheral neuropathy.  2. He also has obstructive sleep apnea which was graded as severe for      which he is on CPAP q.h.s.  3. He has had hypertension.  4. Congestive heart failure.  5. Nonobstructive coronary artery disease by 2005 cardiac catheterization.  6. Diverticulitis in 2004.  7. Right foot ulcer in 2004.  8. Glaucoma.  9. In December of 2005, he had bilateral hidradenitis requiring surgery      with cultures revealing MRSA.   PAST SURGICAL HISTORY:  1. 2004 penis implant.  2. 2005 redo penis implant.  3. December of 2005, bilateral carbuncle excisions.  4. Multiple bilateral retina laser treatment.  5. July of 2005, cardiac catheterization.   MEDICATIONS PRIOR TO ADMISSION:  1. Synthroid 300 mcg every Monday, Wednesday, and Friday and 200 mcg on      other days.  2. CPAP q.h.s.  3. Multivitamin one tablet daily.  4. Benicar 20 mg daily.  5. Nexium 40 mg daily.  6. NovoLog 70/30 insulin 50 units twice daily.  7. Fluoxetine 40 mg daily.  8. Nitroglycerin 0.4 mg sublingual p.r.n. chest pain.  9. MiraLax 17 gm daily.   ALLERGIES:  PENICILLIN.   SOCIAL HISTORY:  He is married and is retired.  He has no history of tobacco  or alcohol abuse.  He has a son and daughter who are in their 23's and in  good health.   FAMILY HISTORY:  Noncontributory.   REVIEW OF SYSTEMS:  See History of Present Illness.   INITIAL PHYSICAL EXAMINATION:  VITAL SIGNS:  Blood pressure 130/90, pulse  64, respirations 20, temperature 98.7, weight 263 pounds which was a five-  pound increase from April 16, 2006.  GENERAL:  He was a mildly overweight  white male who was in no apparent distress.  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  NECK:  Supple without jugular vein distention or carotid bruit.  CHEST:  Clear to auscultation.  HEART:  Regular rate and rhythm with a systolic ejection murmur of grade 1/6  at the left sternal border.  ABDOMEN:  Normal bowel sounds and no hepatosplenomegaly or tenderness.  EXTREMITIES:  Notable for left 1+ pitting edema with warmth and erythema in  the distal one-third of the leg and the distal medial left foot.  The left  foot has a several centimeter wide, heavy callous buildup with cracks in it  and oozing serosanguineous fluid.  There was no obvious purulence.  Both  feet have decreased light touch sensation.  Both pedal pulses are 1+.  NEUROLOGICAL EXAM:  Alert and oriented x3 with a normal affect.  Cranial  nerves II-XII were normal.  He has decreased light touch sensation in both  feet.   SKIN EXAM:  Also notable for bilateral hyperpigmentation of the legs  consistent with chronic venous insufficiency.   INITIAL LABORATORY STUDIES:  Serum sodium 137, potassium 4.1, chloride 100,  carbon dioxide 31, BUN 23, creatinine 1.2, glucose 185, white blood cell  count 9.2, hemoglobin 11, hematocrit 33, platelets 137, total protein 6.9,  albumin 3.3, BNP 194.   Chest x-ray showed cardiomegaly with bibasilar scarring.   MRI of the left foot was pending at the time of dictation.   IMPRESSION AND PLAN:  1. Left foot infection:  It is unclear how deep the infection extends.      Hopefully, he does not have infection extending into the bone.  Given      his history of MRSA, he will be started on empiric Vancomycin with      Avelox.  I will ask an orthopedic consultant to evaluate him for the      possibility of debridement.  In addition, we will obtain bilateral      arterial ultrasound evaluation of flow in anticipation of possible      surgery.  2. Diabetes mellitus, type 2:  His blood glucose levels have been higher      than usual, as his baseline hemoglobin A1c level is 6.1% in November of      2007.  I will continue his 70/30 insulin at his usual doses with      supplemental NovoLog sliding-scale insulin a.c. and q.h.s. as needed.  3. History of coronary artery disease:  Stable with no recent signs of      angina.  4. History of congestive heart failure:  Stable with minimally elevated      BNP.  His BNP level and exam will be      followed closely and diuretics will be used if necessary.  5. Obstructive sleep apnea:  Stable on his current regimen of CPAP which      will be continued as an inpatient.  6. Depression:  Stable on fluoxetine treatment.           ______________________________  Barry Dienes. Eloise Harman, M.D.     DGP/MEDQ  D:  05/11/2006  T:  05/11/2006  Job:  [id number]   cc:   Nadara Mustard, MD  Sigmund I. Patsi Sears, M.D.  Duke Salvia, MD, [facility] [place].  Rankin, M.D.  Wilhemina Bonito. Marina Goodell, MD

## 2010-10-31 NOTE — Discharge Summary (Signed)
[name], [name] NO.:  [id number]   MEDICAL RECORD NO.:  [id number]          PATIENT TYPE:  INP   LOCATION:  5124                         FACILITY:  [facility]   PHYSICIAN:  Barry Dienes. Eloise Harman, M.D.DATE OF BIRTH:  Mar 16, 1946   DATE OF ADMISSION:  03/20/2007  DATE OF DISCHARGE:  03/23/2007                               DISCHARGE SUMMARY   PERTINENT FINDINGS:  The patient is a 65 year old white male with  several medical problems.  On the evening prior to admission, when he  was getting out of his recliner, he noted a sudden increase in his  chronic low back pain so that he was barely able to get to his room with  a walker and assistance.  On the day of admission, his pain had become  quite severe, and he was unable to walk at all, so he called 9-1-1 and  presented to the [facility] emergency room for evaluation.  The pain in  his low back was quite severe, despite lying on his left side due to  shooting pain in the right lower extremity.  He denied change in his  bowel or bladder function.  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  In the 1980s, he had three operations on his lumbar  spine for degenerative disc disease and osteoarthritis.  His last  operation in the lumbar spine was in the 1980s at [facility].   PAST MEDICAL HISTORY:  1. Hypertension.  2. Diabetes mellitus, type 2, complicated by retinopathy, neuropathy,      and nephropathy, with a left neuropathic foot ulcer.  3. Hypothyroidism.  4. Severe obstructive sleep apnea, on CPAP.  5. Congestive heart failure due to diastolic dysfunction.  6. Gout.  7. Chronic obstructive pulmonary disease.  8. Diverticulitis.  9. Colon polyps on 2004 colonoscopy.  10.Gastroesophageal reflux disease.  11.Nonobstructive coronary artery disease by 2005 cardiac      catheterization.  12.December 2005, bilateral hidradenitis, with MRSA,.  13.December 2007, left foot abscess, treated with  debridement and      antibiotics, and bilateral chronic venous insufficiency, with      venous stasis ulcers   See admission history and physical for medications prior to admission,  allergies, past surgical history, social history, family history, and  review of systems.   INITIAL PHYSICAL EXAMINATION:  VITAL SIGNS:  Blood pressure 175/80,  pulse 65, respirations 22, temperature 98.3, pulse oxygen saturation 98%  on room air.  GENERAL:  He is an overweight white male who had twinges of severe low  back pain radiating to the right lower extremity while lying on his left  side.  HEENT:  Grossly normal.  NECK:  Supple, and without jugular venous distention or carotid bruits.  CHEST:  Clear to auscultation.  HEART:  Had a regular rate and rhythm, without significant murmur or  gallop.  ABDOMEN:  Had normal bowel sounds, and no hepatosplenomegaly or  tenderness.  EXTREMITIES:  Without cyanosis, clubbing, or edema.  The pedal pulses  were intact.  There was an ulcer on the dorsum of the  left forefoot.  NEUROLOGIC:  He was alert and well oriented, and able to move all  extremities somewhat.  He has severely decreased light touch sensation  in both feet extending from the toes to just distal to the knees.   INITIAL LABORATORY STUDIES:  White blood cell count 8.4, hemoglobin 15,  hematocrit 45, platelets 156.  Serum sodium 135, potassium 4.2, chloride  102, BUN 18, creatinine 1.3, glucose 204.   HOSPITAL COURSE:  The patient was treated with a pulse dose of Solu-  Medrol for what appear to be an acute herniated disc.  He had an MRI  scan of the lumbar spine done, which showed the following:  1. Central disc extrusion, with caudal migration at L4-5, that      resulted in moderate spinal stenosis centrally and mild right      neural foraminal stenosis.  2. 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  3. Mild spinal stenosis at L3-4 and L5-S1 related to severe disc      disease  and facet degeneration.  At the L5-S1 level, there was      severe left and moderate right neural foraminal stenosis due to      facet spurring and mild central spinal stenosis.  On March 23, 2007, he had therapeutic epidural injection of 120 mg Depo-Medrol      into the epidural space on the right at the L5-S1 level without      complication.   COMPLICATIONS:  None.   CONDITION ON DISCHARGE:  He had less low back pain radiating to the  right side.  His most recent exam showed vital signs with blood pressure  195/84, pulse 54, respirations 20, temperature 97.6, pulse oxygen  saturation 95% on room air.  Most recent capillary blood glucose levels  were 207, 180, and 197.  His chest is clear to auscultation.  His heart  has a regular rate and rhythm.  With gait, he was able to change from a  supine to a sitting position and stand to a walker and walk a few steps  independently.  Skin exam was significant for a 15 mm x 10 mm  superficial ulcer on the mid-left forefoot dorsum that extended to the  underlying muscle.   MOST RECENT LABORATORY TESTS:  TSH 0.20, serum sodium 138, potassium  4.2, chloride 101, carbon dioxide 31, BUN 16, creatinine 1.2, glucose  160.  White blood cells 8.4, hemoglobin 15, hematocrit 43, platelets  156.   DISCHARGE DIAGNOSES:  1. Severe low back pain due to degenerative disc disease.  2. Status post epidural corticosteroid injection at the right L5-S1      level.  3. Hypertension, essential, poorly controlled due to pain.  4. Left foot neuropathic ulcer.  5. Osteoarthritis of the lumbar spine.  6. Constipation.  7. Gastroesophageal reflux disease.  8. Hypothyroidism.  9. Diabetes mellitus, type 2, controlled.  10.Coronary artery disease.   DISCHARGE MEDICATIONS:  1. Iodosorb wound product to the left foot ulcer once daily, covered      by gauze.  2. Vicodin 5/500, 1 tablet p.o. t.i.d. p.r.n. pain, #90.  3. MiraLax 17 g daily.  4. Senna 2 tablets  daily p.r.n. constipation.  5. AcipHex 20 mg daily.  6. Benicar 40 mg daily.  7. Levothyroxine 200 mcg every day.  8. Multivitamin 1 tablet daily.  9. NovoLog 70/30 insulin, take 50 units subcutaneously twice daily.  10.Nitroglycerin 0.4 mg sublingual p.r.n. chest pain.  WOUND CARE INSTRUCTIONS:  He was advised to soak his left foot in  lukewarm water with Dial soap for 20 minutes twice daily, then apply  Iodosorb 20 minutes once daily, then apply Iodosorb to the area covered  by gauze.   DISPOSITION AND FOLLOWUP:  He was advised to have a follow-up evaluation  with Dr. Jarome Matin in the week following discharge.  He was also  advised to have a follow-up repeat cortisone injection of the lumbar  spine that could be arranged at that follow-up visit.  He will also have  home health physical therapy to improve his low back pain and  ambulation.           ______________________________  Barry Dienes Eloise Harman, M.D.     DGP/MEDQ  D:  05/05/2007  T:  05/06/2007  Job:  [id number]   cc:   Wilhemina Bonito. Marina Goodell, MD  Nadara Mustard, MD  Duke Salvia, MD, [facility]  Sigmund I. Patsi Sears, M.D.

## 2010-10-31 NOTE — Assessment & Plan Note (Signed)
[facility]   NAME:  [name], [name]               ACCOUNT NO.:  [id number]   MEDICAL RECORD NO.:  [id number]      DATE OF BIRTH:  1946/03/13   PHYSICIAN:  Jake Shark A. Tanda Rockers, M.D.      VISIT DATE:                                     OFFICE VISIT   SUBJECTIVE:  [name] returns for a followup of bilateral ulcerations of  his lower extremity.  During this interim he has worn compressive hose.  He  reports that there has been decreased drainage.  There has been no malodor.  He has had no fever.   OBJECTIVE:  VITAL SIGNS:  Stable.  He is afebrile.  EXTREMITIES:  Following removal of his multilayer wrap the right lower  extremity is remarkable for the presence of persistent 2+ edema.  There are  no broke areas, although there are heavy eschars with some minimum-to-scant  exudate.  On the left lower extremity there is an open ulceration on the  lateral aspect associated with varying levels of healing eschar.  There is a  moderate amount of exudative drainage but no malodor.   IMPRESSION:  Overall improved.   PLAN:  We will continue external compression utilizing a multilayer wrap.  We will also use the Select Silver product to wick away the exudate on both  lower extremities.  We will reevaluate the patient in 10-14 days.           ______________________________  Theresia Majors Tanda Rockers, M.D.     [name]  D:  12/31/2005  T:  12/31/2005  Job:  [id number]

## 2010-10-31 NOTE — Consult Note (Signed)
NAME:  [name], [name]                         ACCOUNT NO.:  [id number]   MEDICAL RECORD NO.:  [id number]                   PATIENT TYPE:  INP   LOCATION:  3308                                 FACILITY:  [facility]   PHYSICIAN:  Marcelyn Bruins, M.D. [facility]              DATE OF BIRTH:  12/19/1945   DATE OF CONSULTATION:  05/22/2002  DATE OF DISCHARGE:                                   CONSULTATION   HISTORY OF PRESENT ILLNESS:  The patient is a very pleasant 65 year old  gentleman whom I have been asked to see for hypoxemia, hypercarbia, and cor  pulmonale, along with polycythemia.  The patient presented with hypoxemia  and also hypertension and was admitted for management of this as well as to  rule out cardiac process.  The patient states that he has had increasing  dyspnea on exertion over the last one year and prior to that had no  difficulties doing anything.  He has gotten to the point where he will get  short of breath just going through his house.  He has also noted, over the  last two months, significant increase in lower extremity edema.  The patient  also gives a classic history for obstructive sleep apnea with loud snoring,  gasping episodes during the night, as well as poor sleep efficiency and very  inappropriate daytime sleepiness including with driving and sitting down at  the breakfast table drinking coffee.  The patient states that his weight has  been increasing out of control and feels that he is not eating enough to  justify this.  He does have a history of tobacco use, but has not smoked in  25 years.   PAST MEDICAL HISTORY:  Significant for:  1. Diabetes.  2. Hypothyroidism for which he is on Synthroid.  3. History of back surgery.   ALLERGIES:  He is allergic to PENICILLIN.   SOCIAL HISTORY:  He has a history of smoking one pack per day from teenage  years until age 72.  He has not smoked in 25 years.  He is married and has  two children.   FAMILY HISTORY:   Remarkable for coronary artery disease.   REVIEW OF SYSTEMS:  As per history of present illness.  Also, see further  documentation in the chart.   PHYSICAL EXAMINATION:  GENERAL:  He is a morbidly obese white male in no  acute distress.  VITAL SIGNS:  Blood pressure is 150/80, pulse is 55, respiratory rate is 25.  He is afebrile.  His 3 L saturation is 97%.  HEENT:  Pupils equal, round, reactive to light and accommodation.  Extraocular muscles are intact.  Nares shows mild deviation to the right  with turbinate hypertrophy.  Oropharynx shows very small posterior pharynx  with a large tongue and elongated soft palate and uvula and sidewall  narrowing.  NECK:  His neck was quite large and difficult  to examined for JVD.  However,  there was no obvious lymphadenopathy or palpable thyromegaly.  CHEST:  Reveals basilar crackles, otherwise was clear.  CARDIAC:  Reveals a regular rate and rhythm.  ABDOMEN:  Soft, nontender with good bowel sounds.  GUR/BREASTS:  Not done and not indicated.  EXTREMITIES:  Lower extremities show 1 to 2+ edema with some varicosities.  Pulses were intact distally.  There was no calf tenderness.  NEUROLOGIC:  He is alert and oriented and moves all four extremities.   LABORATORY DATA:  Chest x-ray could not be found in radiology this morning.  His sodium was 138, potassium 3.4, chloride 90, bicarb 40, BUN 23,  creatinine 1.2, glucose was 267.  He was found to have a hemoglobin of 18.9.  His arterial blood gas, on room air, shows a PO2 of 42, PCO2 of 65, pH of  7.37 which is well compensated.   IMPRESSION:  1. Classic obstructive sleep apnea/obese hypoventilation syndrome.  The     patient gives all the hallmark symptoms and also is substantiated by his     physical examination.  I really think that he would benefit from BiPAP at     night as well as oxygen therapy to maintain saturations to 92% 24 hours a     day.  I have discussed with him the pathophysiology of  sleep apnea and     the cardiovascular effects.  I have reiterated to him the need to lose     weight and how that will impact his disease process.  2. 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  The patient will need     aggressive diuresis.  3. Polycythemia and thrombocytopenia.  It is really unclear how much of this     is due to his chronic hypoxemia and how much may be due to some type of     hematologic disturbance.   PLAN:  1. We will intimate BiPAP q.h.s.  2. Aggressive diuresis and adjust O2 to keep saturation 92%.  3. Agree with hematologic evaluation.  4. Will follow up TSH.                                               Marcelyn Bruins, M.D. [facility]    KC/MEDQ  D:  05/22/2002  T:  05/22/2002  Job:  [id number]   cc:   Duke Salvia, M.D. [facility]   Barry Dienes. Eloise Harman, M.D.  [address]  [place]  [place] [address]  Fax: [phone]

## 2010-10-31 NOTE — Assessment & Plan Note (Signed)
[facility]                         GASTROENTEROLOGY OFFICE NOTE   JULIA, KULZER                        MRN:          [id number]  DATE:09/23/2006                            DOB:          August 07, 1945    REASON FOR CONSULTATION:  Gas bloating, constipation and surveillance  colonoscopy.   HISTORY:  He is a pleasant 65 year old white male type 2 diabetic  complicated by retinopathy, nephropathy and foot ulcers.  He also has a  history of obstructive sleep apnea for which he is on CPAP,  hypertension, history of congestive heart failure, glaucoma,  diverticulitis and colon polyps.  He presents today regarding the above  listed issues.  Patient last underwent colonoscopy, Oct 26, 2002, to  evaluate change in bowel habits, gas and provide colorectal neoplasia  screening.  At that time, he was found to have multiple colon polyps and  diverticulosis.  Polyps were adenomatous and follow-up in three years  recommended.  His principal GI complaint is that of severe persistent  constipation.  He does take Dulcolax periodically with transient relief.  He has been on MiraLax but only using it on a p.r.n. basis.  He denies  rectal bleeding.  He continues with gas and bloating which he states has  been severe over the past year and is worse with constipation.  After  eventually getting his bowels to move, he will have multiple bowel  movements that particular day.  No nausea, vomiting, or heartburn.  He  does stay on AcipHex for reflux.  Overall, his weight has been stable.   PAST MEDICAL HISTORY:  As above.  In addition, hypothyroidism.   PAST SURGICAL HISTORY:  Debridement of foot ulcers.   ALLERGIES:  PENICILLIN.   CURRENT MEDICATIONS:  1. AcipHex 20 mg daily.  2. Benicar 40 mg daily.  3. Levothyroxine 200 mcg daily.  4. Multivitamin.  5. Insulin 70/30 q.a.m. and q.p.m.  Dose to be determined by sliding      scale.   FAMILY HISTORY:  No family  history of gastrointestinal malignancy.  Both  parents with heart disease.   SOCIAL HISTORY:  Patient is married with two children.  Lives with his  wife.  He has been employed in [employer].  Does not smoke or use  alcohol.   REVIEW OF SYSTEMS:  Per diagnostic evaluation form.   PHYSICAL EXAMINATION:  GENERAL APPEARANCE:  A chronically ill, obese  male in no acute distress.  VITAL SIGNS:  Blood pressure 154/82, heart rate 72, weight 274.6 pounds,  height 6 feet 9 inches.  HEENT:  Sclerae are anicteric.  Conjunctivae are injected.  Oral mucosa  is intact.  Thyroid is normal.  LUNGS:  Clear, though breath sounds are somewhat distant.  CARDIOVASCULAR:  Regular.  ABDOMEN:  Obese and soft without tenderness, mass or hernia.  EXTREMITIES:  Chronic stasis changes and scarring from prior ulcer  disease.  No edema.   IMPRESSION:  This is a 65 year old diabetic with multiple medical  problems who presents today regarding constipation and surveillance  colonoscopy.  In terms of his constipation, this is most  certainly  functional and I have recommended that he change his MiraLax from p.r.n.  to 17 g in 8-10 ounces of water once daily.  He may titrate this to  need.  In terms of his surveillance colonoscopy, he is an appropriate  candidate without contraindication.  However, significant comorbidities  put him at slightly high risk. These include diabetes and sleep apnea. I  recommended that he hold his morning insulin the day of his exam in  order to avoid hypoglycemia.  As well, I have instructed him to bring  his CPAP machine should this be needed during sedation.  We reviewed the  nature of the procedure, its risks, benefits, and alternatives in great  detail.  He understood and was interested in proceeding.     Wilhemina Bonito. Marina Goodell, MD  Electronically Signed    JNP/MedQ  DD: 09/23/2006  DT: 09/23/2006  Job #: [id number]   cc:   Barry Dienes. Eloise Harman, M.D.

## 2010-10-31 NOTE — Assessment & Plan Note (Signed)
[facility]   NAME:  FARAZ, PONCIANO               ACCOUNT NO.:  [id number]   MEDICAL RECORD NO.:  [id number]           DATE OF BIRTH:   PHYSICIAN:  Theresia Majors. Tanda Rockers, M.D. VISIT DATE:  01/11/2006                                     OFFICE VISIT   VITAL SIGNS:   PURPOSE OF TODAY'S VISIT:  Mr. Golla returns for follow-up of bilateral  inflammatory stasis ulcerations.  In the interim, he has worn Profore wraps  and triamcinolone ointment.  He denies excessive drainage, pain, or fever.   WOUND EXAM:  VITAL SIGNS:  Blood pressure is 132/80, pulse rate of 76,  respirations 18, and he is afebrile.  EXTREMITIES:  The lower extremity exam is remarkable for persistence of 2+  edema.  The areas of desquamation and ulceration have all improved.  There  are thick, mature, well-adherent eschars on both lateral aspects of the  lower extremity.  The hyperemia is still present.  There is no drainage or  weeping.  Pedal pulses remain palpable.   WOUND SINCE LAST VISIT:   CHANGE IN INTERVAL MEDICAL HISTORY:   DIAGNOSIS:  Improved.   TREATMENT:   ANESTHETIC USED:   TISSUE DEBRIDED:   LEVEL:   CHANGE IN MEDS:   COMPRESSION BANDAGE:   OTHER:   MANAGEMENT PLAN & GOAL:  We will return the patient to Profore wraps with  triamcinolone ointment.  We will leave these in place for ten days.  We will  reevaluate the patient at that time.           ______________________________  Theresia Majors Tanda Rockers, M.D.    [name]  D:  01/11/2006  T:  01/11/2006  Job:  [id number]

## 2010-10-31 NOTE — Assessment & Plan Note (Signed)
[facility]   NAME:  [name], [name]               ACCOUNT NO.:  [id number]   MEDICAL RECORD NO.:  [id number]      DATE OF BIRTH:  08/05/1945   PHYSICIAN:  Jonelle Sports. Sevier, M.D.  VISIT DATE:  04/01/2006                                     OFFICE VISIT   HISTORY:  This 65 year old white male has been followed for an extended  period of time in this clinic for a staphylococcal infection involving both  lower extremities.  His illness had actually begun with a staphylococcal  sepsis, which proved to be MRSA, about 16 months ago.  It was during that  time that he had multiple axillary abscesses and developed this infection on  the pretibial areas of both lower extremities.  He has been treated here,  through the months, with extended anti-MRSA therapy and other topical  measures and had progressively improved until it was recently felt that he  could not go simply with the use of Protopic ointment on the persistent  areas on his pretibials bilaterally.   He reports that, since his last visit here, things have continued to improve  and that he has safely used the Protopic ointment on a daily basis.  He also  reports that he has done some saline soakings on the areas, which was  contrary to our particular advice.   EXAMINATION:  He does have reddened areas on both pretibial zones.  These  measure approximately 10 x 8 cm each and, on the right leg, there are 2 or 3  areas were there is slight hemorrhagic encrustation.  There is no evidence  of active infection.  His vital signs today show blood pressure 140/92,  pulse 68 and regular, respirations 20, temperature 98.4.   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   DISPOSITION:  The hemorrhagic encrusted areas were gently removed through  the use of a paper-edge debridement and reveal intact skin beneath.   The patient is instructed to continue his daily  cleansing and applications  of tacrolimus ointments and to avoid all forms of skin or cutaneous trauma  for fear of re-seeding with staphylococcus.   The patient asks if he still harbors MRSA in his body and is told that  likely he does, but that there would appear to be no real threat to his  household companions in that they have not shown evidence of problems with  MRSA despite his extended active stage.   The patient, today, is released from the clinic for further follow up on a  p.r.n. basis only.           ______________________________  Jonelle Sports Cheryll Cockayne, M.D.     RES/MEDQ  D:  04/01/2006  T:  04/02/2006  Job:  [id number]

## 2010-10-31 NOTE — H&P (Signed)
NAMEBLAINE, HARI NO.:  [id number]   MEDICAL RECORD NO.:  [id number]          PATIENT TYPE:  INP   LOCATION:  0471                         FACILITY:  [facility]   PHYSICIAN:  [name], M.D.DATE OF BIRTH:  [birth year]   DATE OF ADMISSION:  05/16/2004  DATE OF DISCHARGE:                                HISTORY & PHYSICAL   CHIEF COMPLAINT:  1.  Multiple abscesses, right and left axilla.  2.  Diabetes mellitus.  3.  Obesity.   HISTORY:  [name] is a 65 year old diabetic who was seen in the office  today by Dr. Jerelene Maldonado for infections at both axillae that the patient  stated only started 2 or 3 days earlier. He is a diabetic and actually saw  his medical doctor, Dr. Jarome Maldonado, yesterday who placed him on an oral  antibiotic, I am not sure exactly its name, and with no improvement he was  seen today with the findings of a carbuncle right axilla, 2 carbuncles left  axilla. He is on insulin plus other chronic medications, and Dr. Maryagnes Maldonado saw  him and said this definitely needed operative debridement and sent him over  for me, the surgeon on call, to manage. The patient states that he swells  with penicillin, and is not aware of whether he or anybody in his family has  had previous problems with MRSA, but the incision over the abscesses  certainly make one worry about possibly MRSA; it is not just Staphylococcus  aureus. Laboratory studies were done and his white count is 11,900, he is  afebrile, hematocrit of 40, and his glucose today is 175 with normal  electrolytes and a BUN of 21 and a creatinine of 1.7. Previously the patient  has had numerous admissions, more recent 2 or 3 have been related to a  penile prosthesis that [name], M.D. performed. I think that he  has been the last time in the hospital in January of 2005. The patient  states that it was years ago that he had an abscess that required I&D and he  really does not have  any idea how he has got these multiple axillary  abscesses. He has not had a problem with hidradenitis.   CHRONIC MEDICATIONS:  1.  He is on Synthroid 100 mcg a day.  2.  Humalog 75/20 - he is on 30 units a.m. and 50 units p.m.  3.  He is on Nexium once a day.  4.  Clesterol 1 per day.  5.  A baby aspirin daily.  6.  As far as his blood pressure medications I am not sure exactly what he      is on, but he has a blood pressure pill.   PHYSICAL EXAMINATION:  GENERAL:  He is an overweight male, kind of slow in  talking, but cooperative.  EYES, EARS, NOSE AND THROAT:  He appears adequately hydrated. Oral exam  essentially negative.  NECK:  No cervical or supraclavicular adenopathy.  LUNGS:  He has got good breath sounds bilaterally.  CARDIAC:  Normal sinus rhythm.  CHEST:  Under each axilla, on the right is about a 3-cm area with necrotic  overlying skin that is draining frank pus;  in the left there are actually  two but smaller, one kind of medial axilla and the other one kind of on the  inner aspect of the upper arm more laterally.  ABDOMEN:  A very protuberant abdomen with no obvious organomegaly. The  penile prosthesis is palpable and actually the reservoir, etc. is.  GENITOURINARY AND RECTAL:  I did not do a rectal and pelvic exam, but there  is no evidence of any hidradenitis or infections in the groin.  EXTREMITIES:  Kind of trace pedal edema. Peripheral pulses I think are  adequate.  SKIN:  There is marked kind of like a fungus infection over the axilla, but  I do not see this type of rash in the groin or inframammillary creases.   ADMISSION IMPRESSION:  1.  Multiple abscesses, left and right axilla.  2.  History of diabetes mellitus.  3.  Exogenous obesity.   PLAN:  The patient has been started on Ancef, and will take him to the  operating room for debridement and drainage of these 2 or 3 abscesses and  will certainly await the results to see whether this could be  possibly MRSA.  He says that he has not had any problems with general anesthesia and will  definitely need general anesthesia for the surgical procedure. I will get an  EKG. He has had a chest x-ray within this year.      WJW/MEDQ  D:  05/16/2004  T:  05/17/2004  Job:  [id number]

## 2010-10-31 NOTE — Assessment & Plan Note (Signed)
[facility]   NAME:  [name], [name]               ACCOUNT NO.:  [id number]   MEDICAL RECORD NO.:  [id number]      DATE OF BIRTH:  1945/08/29   PHYSICIAN:  Jake Shark A. Tanda Rockers, M.D. VISIT DATE:  03/04/2006                                     OFFICE VISIT   SUBJECTIVE:  [name] is a 65 year old man who has been treated for over a  year for bilateral ulcerations in his lower extremity associated with edema,  hyperemia, and intense inflammation.  Most recently, we have changed his  therapy to be directed toward treating this chronic inflammatory status  which we felt was consistent with an atrophic dermatitis with ulceration.  He has utilized Protopic ointment 0.1%.  He returns for follow up.   He also had a subinguinal hematoma that was infected treated with toenail  avulsion and has had an interim course of Levaquin.  He denies pain or  fever.   OBJECTIVE:  Blood pressure 120/80, respirations 20, pulse rate 80, and is  afebrile.  Inspection of his lower extremities show that the wounds have  completely resolved.  The left great toenail bed has some hyperemia and  minimal drainage.   ASSESSMENT:  Resolved wounds 1 and 2 with a clinical improvement of wound  number 3.   PLAN:  We will discharge the patient with regards to wounds 1 and 2.  We  will re-evaluate him within 30 days to assess the continued improvement.  He  is to continue the course of Levaquin for the toe.  If before the 30 day  period, he experiences recurrence of his wounds, he is to call the clinic  for an appointment.           ______________________________  Theresia Majors. Tanda Rockers, M.D.     [name]  D:  03/04/2006  T:  03/05/2006  Job:  [id number]

## 2010-10-31 NOTE — Op Note (Signed)
NAMEJERELLE, [name] NO.:  [id number]   MEDICAL RECORD NO.:  [id number]          PATIENT TYPE:  INP   LOCATION:  0471                         FACILITY:  [facility]   PHYSICIAN:  Anselm Pancoast. Weatherly, M.D.DATE OF BIRTH:  12/01/1945   DATE OF PROCEDURE:  05/16/2004  DATE OF DISCHARGE:                                 OPERATIVE REPORT   PREOPERATIVE DIAGNOSES:  1.  Multiple abscess, axillae, right and left.  2.  Diabetes mellitus.  3.  Exogenous obesity.   POSTOPERATIVE DIAGNOSES:  Large right axillary abscess and three left  axillary abscesses.   OPERATION:  Drainage and debridement of abscesses.   ANESTHESIA:  General.   SURGEON:  Anselm Pancoast. Zachery Dakins, M.D.   ASSISTANT:  Nurse.   HISTORY:  [name] is a 65 year old diabetic of longstanding who is  managed by Dr. Jarome Matin.  Saw Dr. Jarold Motto yesterday with, he said,  about a two-day history of tenderness in the axillae and was started on oral  antibiotics.  The patient was not improved and was seen today in the urgent  clinic by Dr. Maryagnes Amos, who called and recommended that these be drained in the  operating room because of the complexity and multiple abscesses.  Patient  states that it has been years since he has had any abscesses, and he has  never had any previous problems with MRSA.  He has had several surgeries  approximately a year ago relating to penile prosthesis and revisions, etc.,  but there is no abscess, fortunately, in the groin.  The patient states that  he is allergic to penicillin, but he has received Kefzol, so I gave him 1 gm  of Kefzol immediately preoperatively and took him to the operating room.   Patient was positioned on the operating room table.  Induction of general  anesthesia via endotracheal tube.  He was prepped with arms out.  Because of  the IV, we actually worked on the right axilla first and then kind of re-  draped, etc., still one set up for the left side.  The area  on the right,  which is the larger, I ellipsed necrotic skin in this area, and the area is  most likely an abscess and not a sebaceous cyst, but there is just frank  necrosis with a large amount of purulence that was debrided.  There are  numerous little bleeders that required cauterization for hemostasis, and  then after the area had been completely removed and hemostasis, it was  packed with a saline sponge.  We then directed our attention to the other  side and cultured it anaerobically.  All of the specimens were sent to a  single container for pathology.  Next, on the right, after a Betadine prep,  sterile drapes and towels, I ellipsed out the two areas.  The more medial  one is about 1 cm less in size than the right with necrotic, obviously  infected tissue, was cultured and removed.  The second abscess was more  lateral, kind of in the inner aspect of the arm was treated in a  similar  manner.  Hemostasis was more difficult on this one, but I think it was  probably more related to the position, since we were kind of working with  not as good exposure because of its location in the arm location, etc.  After completion of this, I could still feel a thickening between the two,  and there was no overlying skin breakdown.  I made an incision, and there  was a third abscess here, but smaller, debrided out and drained.  I then  packed all three of these after hemostasis had been contained with cautery  with Betadine-soaked gauze and 4x4s, and opened 4x4s held in place with  Hypafix.  I then repacked the right side also with Betadine-soaked gauze and  4x4s held in place.  I  think that the patient will need to be admitted for diabetes management and  these abscessed, and I am going to treat him with Kefzol and also go ahead  and put him on doxycycline under the possibility that this is MRSA.  It  certainly looks like MRSA.  Did not do a Gram's stain.  Will treat him with  sliding coverage  plus his routine insulin coverage.      WJW/MEDQ  D:  05/16/2004  T:  05/17/2004  Job:  [id number]   cc:   Barry Dienes. Eloise Harman, M.D.  [address]  [place]  [place] [address]  Fax: [phone]

## 2010-10-31 NOTE — Assessment & Plan Note (Signed)
[facility]   NAME:  [name], [name]                ACCOUNT NO.:  [id number]   MEDICAL RECORD NO.:  [id number]          DATE OF BIRTH:   PHYSICIAN:  Theresia Majors. Tanda Rockers, M.D. 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  In the interim he  has been managed with compressive wraps.  He reports some moderate drainage  but no fever.  He is also complaining of some moderate pain which was not  controlled with over-the-counter Tylox.   OBJECTIVE:  On today's exam his vital signs are stable, he is afebrile.  Inspection of the lower extremity shows that the wounds are essentially  unchanged with some areas of slough.  There is bilateral 2+ edema.  There  are areas of slough that are scattered throughout, islands of completely  epithelialized tissue.  There is no particular malodor.   Areas of slough have been debrided to healthy, granulating tissue.   ASSESSMENT:  No significant change in the wounds.   PLAN:  We have cleansed the wounds with Anasept gel.  We will reapply the  compression wraps to control the associated 1-2+ edema.  We will reevaluate  the patient in 1 week.           ______________________________  Theresia Majors. Tanda Rockers, M.D.     [name]  D:  11/13/2005  T:  11/13/2005  Job:  [id number]

## 2010-10-31 NOTE — Op Note (Signed)
NAME:  [name], [name]                         ACCOUNT NO.:  [id number]   MEDICAL RECORD NO.:  [id number]                   PATIENT TYPE:  AMB   LOCATION:  DAY                                  FACILITY[facility]  [facility]   PHYSICIAN:  Sigmund I. Patsi Sears, M.D.         DATE OF BIRTH:  March 02, 1946   DATE OF PROCEDURE:  08/20/2003  DATE OF DISCHARGE:                                 OPERATIVE REPORT   PREOPERATIVE DIAGNOSIS:  Penile prosthesis malfunction.   POSTOPERATIVE DIAGNOSIS:  Penile prosthesis malfunction.   OPERATION/PROCEDURE:  1. Scrotal exploration.  2. Drainage of right hydrocele.  3. Repositioning of penile pump.   SURGEON:  Sigmund I. Patsi Sears, M.D.   ASSISTANT:  Theodosia Paling, M.D.   ANESTHESIA:  General endotracheal anesthesia.   COMPLICATIONS:  None.   DRAINS:  None.   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  However, due to positioning of the penile pump within  the scrotum, the patient has been unable to fully utilize his prosthetic  device.  He is brought to the operating room today to undergo scrotal  exploration and repositioning of the penile pump.  He has consented to this  after understanding the risks, benefits and alternatives.   DESCRIPTION OF PROCEDURE:  Following identification by his arm bracelet, the  patient was brought to the operating room.  He was then given preoperative  IV antibiotics and placed in the supine position.  He underwent general  endotracheal anesthesia.  His perineum and genitalia were then prepped and  draped in the sterile fashion.  An appropriately 4 cm peniloscrotal incision  was then made in the midline raphe.  Bovie electrocautery was then used to  incise the layers of the scrotum in the dartos fascia to the level of the  penile pump.  During dissection, the right hydrocele was drained.  Also  during  the dissection, a second incision was formed just lateral to the  midline raphe incision.  Once the pump device was adequately mobilized, an  opening in the deep dartos was made and the penile pump was brought more  caudal into the scrotum in an area easily accessible for use in the future.  A new dartos pouch was then  created using Ray-Tec sponge.  The dartos  window created was then closed in interrupted fashion using 3-0 Vicryl  suture.  The opening in the tunica used to drain hydrocele was then closed,  also using 3-0 Vicryl in a continuous running fashion.  The remainder of the  dartos layer was closed in two layers with running 3-0 Vicryl suture.  A 3-0  Vicryl suture was used to close the skin in a running fashion.  We earlier  created a lateral incision measuring approximately 2 cm.  It was then also  closed following two-layer closure of the dartos  tissue.  A 15-blade was  used to excise skin edges prior to skin closure with 4-0 Monocryl running  suture.  Prior to skin closure, excellent hemostasis was obtained using  Bovie electrocautery.  Collodium was then applied to both skin incisions.  Kerlix fluff was applied to the patient's scrotum with mesh panties.  This  marked termination of the procedure.  The patient tolerated the procedure  well and there were no complications.   Please note that Dr. Patsi Sears was present and participated in the entire  case.  At conclusion of the case, the penile pump was easily palpable in a  superficial location, approximately mid scrotum.   DISPOSITION:  After awakening from general anesthesia, the patient was  transferred to the post anesthesia care unit in stable condition.  From  here, he will be discharged from home on Percocet for pain and clindamycin  for antibiotic coverage.  A followup appointment will be scheduled by the  urology clinic.     Susanne Borders, MD                           Sigmund I. Patsi Sears, M.D.    DR/MEDQ  D:   08/20/2003  T:  08/20/2003  Job:  [id number]

## 2010-10-31 NOTE — Assessment & Plan Note (Signed)
[facility]   NAME:  FLOYED, MASOUD               ACCOUNT NO.:  [id number]   MEDICAL RECORD NO.:  [id number]      DATE OF BIRTH:  December 09, 1945   PHYSICIAN:  Jake Shark A. Tanda Rockers, M.D. VISIT DATE:  02/18/2006                                     OFFICE VISIT   SUBJECTIVE:  Mr. Cooks returns for follow-up of bilateral inflammatory  ulcerations of his lower extremity.  In the interim he reports no drainage  and he has been using topical protopic ointment.   VITAL SIGNS:  Blood pressure is 150/65, respiratory rate 18, pulse 72 and he  is afebrile.  Capillary blood glucose is 163 mg%.   PURPOSE OF TODAY'S VISIT:   WOUND EXAM:  The wounds have completely re-epithelialized.  There is no  drainage.  The patient is complaining of a thickness in his left great toe.  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 The nail was totally  avulsed with the matrix cauterized with silver nitrate.   WOUND SINCE LAST VISIT:   CHANGE IN INTERVAL MEDICAL HISTORY:   DIAGNOSIS:   TREATMENT:   ANESTHETIC USED:   TISSUE DEBRIDED:   LEVEL:   CHANGE IN MEDS:   COMPRESSION BANDAGE:   OTHER:   ASSESSMENT:  Resolved ulcerations with persistent inflammatory changes in  the lower extremity and adequately debrided nail bed on the first digit of  the left foot.   MANAGEMENT PLAN & GOAL:  The patient will continue Protopic ointment for the  inflammatory wounds of his lower extremity and he will bathe his great toe  twice daily with antiseptic soap.  We will re-evaluate him in two weeks  p.r.n.           ______________________________  Theresia Majors. Tanda Rockers, M.D.     [name]  D:  02/18/2006  T:  02/18/2006  Job:  [id number]

## 2010-10-31 NOTE — Discharge Summary (Signed)
NAMERICHIE, BONANNO NO.:  [id number]   MEDICAL RECORD NO.:  [id number]          PATIENT TYPE:  INP   LOCATION:  0471                         FACILITY:  [facility]   PHYSICIAN:  Anselm Pancoast. Weatherly, M.D.DATE OF BIRTH:  [birth year]   DATE OF ADMISSION:  05/16/2004  DATE OF DISCHARGE:  05/19/2004                                 DISCHARGE SUMMARY   DISCHARGING DIAGNOSES:  1.  Multiple abscesses, axilla, methicillin-resistant Staphylococcus aureus.  2.  Diabetes mellitus.  3.  History of hypertension.   OPERATION:  Drainage and excision of multiple abscesses, 3 left and 1 right,  large.   HISTORY:  [name] is a 65 year old Caucasian male, who was seen in the  office by Dr. Jerelene Redden on Friday with abscesses that he said had been  increasing in size and tenderness for approximately a week.  He had used  several home remedies and also had seen Jarome Matin, MD, the previous  day, who had placed him on oral antibiotics but advised that he see Dr.  Maryagnes Amos, who had managed him previously for this problem.  He saw Dr. Maryagnes Amos,  who found that the abscesses were large enough and painful enough that he  did not think they could be adequately drained with local anesthesia, and I  was on call, and he asked me to manage the patient.  The patient has a  history of diabetes which is controlled by Dr. Eloise Harman, and he has not had  significant problems with his diabetes as long as his medications are  adequately managed.  He has recently been started on antihypertensive  medication, but he did not know the name of the medication.  The patient  presented to the outpatient surgery area, and his laboratory studies showed  a white count of 11,900.  His hematocrit was 40.  His electrolytes were  satisfactory except his glucose was 175, and his creatinine was 1.7.  He did  have some glucose 250 in his urine.  EKG and chest x-ray was performed, and  he has had previous surgery  from a penile prosthesis by Dr. Patsi Sears.  He  was taken to surgery and under general anesthesia, I basically excised or  drained and debrided these abscesses.  They were frank, a lot of purulence  and whether this was kind of like sebaceous cysts that had gotten infected  or actually frank abscess, I could not definitely decide, and the specimen  was sent combined to pathology, but it has not been completed at this time.  The cultures were obtained, both the right and left.  There were three  abscesses, each requiring about a 2-3 cm incision on the left, a larger,  about a 3-4 cm on the right that was removed through a single incision and  then packed with Betadine gauze.  I kept him on Kefzol intravenously, and  the wound is clearing up.  The patient is feeling better.  His sugar is  coming under better control.  The dressings were changed, and there was some  bleeding from the lateral aspect of the left  that required suturing of a  little vein for good hemostasis at bedside yesterday, or he would have been  possibly discharged yesterday.  His wounds have been changed again today.  The erythema has all subsided.  The wounds are clean, but the culture is  showing MRSA, and we will discharge him on doxycycline 100 mg b.i.d. for  approximately 2 weeks in addition to Betadine solution and warm showers to  wash the area twice a day.  He is  keeping cover dressings on it and continuing with his chronic medications  for both his diabetes and his antihypertension.  He will be seen in the  office on Friday and says he does not need any pain medication except  Tylenol.      WJW/MEDQ  D:  05/19/2004  T:  05/20/2004  Job:  [id number]   cc:   Barry Dienes. Eloise Harman, M.D.  [address]  [place]  [place] [address]  Fax: [phone]

## 2010-10-31 NOTE — Assessment & Plan Note (Signed)
[facility]   NAME:  [name], [name]               ACCOUNT NO.:  [id number]   MEDICAL RECORD NO.:  [id number]      DATE OF BIRTH:  Aug 10, 1945   PHYSICIAN:  Jake Shark A. Tanda Rockers, M.D. VISIT DATE:  12/24/2005                                     OFFICE VISIT   VITAL SIGNS:  Blood pressure 130/88, pulse rate 76, respirations 20,  afebrile.   PURPOSE OF TODAY'S VISIT:  [name] returns for followup of bilateral  vasculogenic ulcerations.  During the interim, he has been treated with  Profore light, topical nystatin and Telfa.  He reports less drainage, no  pain and no fever.   WOUND EXAM:  Inspection of the lower extremity shows 1+ edema with areas of  inflammation and excoriation on the lateral aspect of both lower  extremities.  There are areas of mature eschar which were easily peeled off.  Other areas of slough were debrided from lower legs.  Resultant hemorrhage  was controlled with direct pressure.   TREATMENT:  A nystatin application with Telfa and Profore light dressing was  reapplied.   ASSESSMENT:  Slow improvement of inflammatory ulcerations of the lower  extremity.   MANAGEMENT PLAN & GOAL:  We will reevaluate the patient in one week.           ______________________________  Theresia Majors. Tanda Rockers, M.D.     [name]  D:  12/24/2005  T:  12/24/2005  Job:  [id number]

## 2010-10-31 NOTE — Op Note (Signed)
NAME:  [name] NO.:  [id number]   MEDICAL RECORD NO.:  [id number]          PATIENT TYPE:   LOCATION:                                 FACILITY:   PHYSICIAN:  Theresia Majors. Tanda Rockers, M.D.     DATE OF BIRTH:   DATE OF PROCEDURE:  10/19/2005  DATE OF DISCHARGE:                                 OPERATIVE REPORT   [name] is a 65 year old man who has been followed in the clinic and  managed with a ulceration of the lower extremities.  In the interim he has  had on a compressive wrap with Nystatin ointment.  He reports that there has  been a decrease in drainage and no pain.  He continues to be ambulatory.   OBJECTIVE:  His vital signs stable, he is afebrile.  Blood pressure is  142/84, pulse rate of 62, respirations 18.   The exam of the lower extremity shows that the wounds in both lower  extremities have stabilize.  There is desquamation but no intense  inflammation or drainage.  The wounds remained extraordinarily friable with  easily bleeding with minimal manipulation.  Review of his cultures have  shown only normal skin flora and the occasional Candida.   ASSESSMENT:  Improvement.   PLAN:  Will continue the compression wraps for his associated 2 to 3+ edema  and we will continue the topical Nystatin.  We will see the patient in  follow-up in 1 week.           ______________________________  Theresia Majors. Tanda Rockers, M.D.     [name]  D:  10/19/2005  T:  10/20/2005  Job:  [id number]

## 2010-10-31 NOTE — Assessment & Plan Note (Signed)
[facility]   NAME:  [name], [name]               ACCOUNT NO.:  [id number]   MEDICAL RECORD NO.:  [id number]            DATE OF BIRTH:   PHYSICIAN:  Theresia Majors. Tanda Rockers, M.D. VISIT DATE:  11/20/2005                                     OFFICE VISIT   SUBJECTIVE:  [name] is being followed for bilateral ulcerations in his  lower extremity of a questionable etiology, but highly suggestive of stasis  and vasculitis. These wounds in the past have been very friable. In the  interim he denies drainage, fever, or malodor.   OBJECTIVE:  His vital signs are stable. Blood pressure is 180/102. He is on  antihypertensive medications by his PCP. Pulse rate is 60, respirations 20,  and he is afebrile. The exam of the lower extremities show a 2+ edema with  the ulcerations of the left lower extremity improved. There is healthy  granulation tissue over the base. There are decreased amounts of desquamated  skin. On the right, there is only desquamated skin, but no penetrating frank  ulcer.   ASSESSMENT:  Improved.   PLAN:  We will continue to use the antiseptic gel and Nystatin on the direct  areas.   The patient expressed concern about the charges related to his frequent  visits. We have explained to him that the weekly care has been indicated for  the management of these excessive draining wounds. He is requesting that we  extend his follow-up to at least two weeks to help decrease the amount of  out-of-expense being charged to him by Medicare. We have consented, but we  have advised him that if these dressings become supersaturated and/or  aesthetically unpleasing for him to give [place] a call. It is not our intent for  him to have malodorous draining dressings in public. He seems to understand  and we will see him in two weeks.           ______________________________  Theresia Majors Tanda Rockers, M.D.     [name]  D:  11/20/2005  T:  11/20/2005  Job:  [id number]

## 2010-10-31 NOTE — Assessment & Plan Note (Signed)
[facility]   NAME:  [name], [name] NO.:  [id number]   MEDICAL RECORD NO.:  [id number]           DATE OF BIRTH:   PHYSICIAN:  Maxwell Caul, M.D. VISIT DATE:  02/26/2006                                     OFFICE VISIT   PURPOSE OF VISIT:  [name] returned at his request for followup of  perceived infection in the left great toe.   HISTORY OF PRESENT ILLNESS:  [name] was here on February 18, 2006. He  had his nail removed. The patient became concerned when some erythema came  in the nail bed, extending up the toe, and came for review of this.   WOUND EXAMINATION:  The matrix of the nail bed was covered with a necrotic  material that was full thickness debrided. I did not see anything that  really needed to be cultures. There was significant erythema, extending  around the nail bed and also up towards the interphalangeal joint of the  great toe. He is insensate, so he does not feel pain. However, it is quite  possible that there is an infection here.   MANAGEMENT/PLAN/FOLLOW-UP:  The area of the nail bed was full thickness  debrided for necrotic material. Hemostasis was obtained with direct  pressure. He will clean the area with an antiseptic soap and apply  Bacitracin ointment to the area. He has a followup with [place] on Thursday of  next week. We have also given him a prescription for Levaquin. He is  allergic to PENICILLIN and is not on Coumadin.           ______________________________  Maxwell Caul, M.D.     MGR/MEDQ  D:  02/26/2006  T:  02/27/2006  Job:  [id number]

## 2010-10-31 NOTE — Discharge Summary (Signed)
NAME:  [name], [name]                         ACCOUNT NO.:  [id number]   MEDICAL RECORD NO.:  [id number]                   PATIENT TYPE:  INP   LOCATION:  3308                                 FACILITY:  [facility]   PHYSICIAN:  Barry Dienes. Eloise Harman, M.D.            DATE OF BIRTH:  Oct 22, 1945   DATE OF ADMISSION:  05/21/2002  DATE OF DISCHARGE:  05/25/2002                                 DISCHARGE SUMMARY   HISTORY OF PRESENT ILLNESS:  The patient is a 64 year old white male with  several medical problems who is admitted via the emergency room with  progressive dyspnea and orthopnea over the past month prior to admission. He  also complained of bilateral leg edema. He denied fevers, chills, or a  productive cough. He presented to the [facility] emergency room and was  admitted with congestive heart failure. He denied significant chest  discomfort.   PAST MEDICAL HISTORY:  His past medical history is significant for diabetes  mellitus type 2 for approximately 12 years which has been completed by  peripheral neuropathy. He has no history of coronary artery disease or  congestive heart failure. He has chronic low back pain, hypothyroidism, and  likely newly diagnosed obstructive sleep apnea.   MEDICATIONS PRIOR TO ADMISSION:  1. Synthroid 50 mcg p.o. q.d.  2. NPH insulin 20 to 30 units subcutaneously daily via sliding scale.  3. Multivitamin one tablet daily.   PHYSICAL EXAMINATION:  VITAL SIGNS:  Blood pressure 194/112, pulse 58,  respiratory rate 16.  GENERAL:  He is an obese white male who had mild dyspnea.  HEENT:  Significant for plethoric faces.  NECK:  No evidence of carotid bruit or jugular venous distention.  CHEST:  Has scattered crackles.  HEART:  Had a regular, rate, and rhythm. S1 and S2 were present without  murmurs, rubs, or gallops.  ABDOMEN:  Had normal bowel sounds with no hepatosplenomegaly.  GENITALIA:  Had a swollen scrotum.  NEUROLOGIC EXAM:  Significant for  bilateral decreased light touch of the  feet.  EXTREMITIES:  Bilateral 2+ pitting edema of the legs.   LABORATORY DATA:  EKG showed sinus bradycardia with a rightward axis and  nonspecific ST segment and T-wave abnormalities. A CK level was 389 with MB  15. Potassium 5.1, bicarbonate 38, serum creatinine 1.5. Hemoglobin 18.9,  hematocrit 58, platelet count 56. Chest x-ray showed under penetration due  to body habitus.   HOSPITAL COURSE:  The patient was admitted by Dr. Berton Mount for congestive  heart failure. He was treated with IV nitroglycerin and high dose diuretics.  Serial cardiac isoenzymes remained in the 300 range. A B-natriuretic peptide  level was 422 on admission. A troponin I level was 0.04. A TSH level was  45.953.   The patient had several diagnostic studies including a echocardiogram that  showed:  1. Limited study.  2. Preserved left ventricular systolic function.  3. No  significant valvular heart disease.   Abdominal ultrasound exam showed:  1. No gallstones but probable biliary sludge.  2. No bile duct dilatation.  3. An echogenic liver.  4. Spleen size at the upper limits of normal.  5. Normal kidneys.   An adenosine Cardiolite test showed a left ventricular ejection fraction of  57% with no evidence of ischemia or prior infarct.   The patient was seen by Dr. Shelle Iron to evaluate his hypoxia on room air  without significant dyspnea. Dr. Shelle Iron felt that he had classic obstructive  sleep apnea and obesity hypoventilation syndrome. He recommended initiation  of BiPAP treatment at night as well as oxygen therapy during the day to  maintain oxygen saturations greater than 90%. He also advised that he make  strong efforts to loose weight. Dr. Truett Perna evaluated his polycythemia with  thrombocytopenia. He felt that this again was most likely due to obstructive  sleep apnea with passive spleen congestion. Subsequent platelet counts  improved with diuresis. His  diabetes was also treated with changing his  insulin regimen from NPH to 70/30 mixed insulin with gradual increase of  doses.   PROCEDURES:  Abdominal ultrasound, adenosine Cardiolite test, transthoracic  echocardiogram.   COMPLICATIONS:  None.   CONDITION ON DISCHARGE:  He continues to have a dry cough but is without  significant dyspnea at rest and with ambulating in the room. He continues to  have mild bilateral thigh soreness. On the day of discharge, he was able to  arise from sitting to standing position without assistance and ambulate in  the room again without assistance or significant dyspnea. Of note, he feels  that his wife and brother who lives nearby can assist him with any needs  that he may have in the immediate postoperative hospitalization period.   DISCHARGE DIAGNOSES:  1. Congestive heart failure.  2. Cor pulmonale.  3. Obstructive sleep apnea.  4. Polycythemia.  5. Thrombocytopenia.  6. Hypothyroidism.  7. Diabetes mellitus type 2 uncontrolled.  8. Obesity.  9. Diabetic peripheral neuropathy.  10.      Lumbar spine osteoarthritis.   DISCHARGE MEDICATIONS:  1. Benicar 40 mg one tablet p.o. q.d.  2. Demadex 40 mg one tablet p.o. b.i.d.  3. Novolog 70/30 mixed insulin 22 units subcutaneous q.a.m., 20 units     subcutaneous q.p.m.  4. Synthroid 100 mcg p.o. q.d.  5. Nasal cannula oxygen at 2 liters per minute during the day time hours in     line with his BiPAP machine.  6. BiPAP with inspiratory pressures at 12 cm of water and expiratory     pressures at 8 cm.  7. Multivitamins one tablet daily.  8. Ecotrin 325 mg daily.   SPECIAL INSTRUCTIONS:  He was advised to check his body weight once daily  and call if his weight increases by 5 pounds from today's weight. He is to  check blood sugar levels prior to each meal and at bedtime for seven days  and before breakfast and dinner.  FOLLOW UP:  He should be seen within one week by Dr. Eloise Harman at [facility] and was given a number to call for that appointment. He  should be seen by Dr. Graciela Husbands per his recommendations. Again, he should be  seen by Dr. Shelle Iron per his recommendations.  Barry Dienes Eloise Harman, M.D.    DGP/MEDQ  D:  05/25/2002  T:  05/25/2002  Job:  [id number]   cc:   Marcelyn Bruins, M.D. [facility], M.D.

## 2010-10-31 NOTE — Assessment & Plan Note (Signed)
[facility]   NAME:  RAWSON, MINIX               ACCOUNT NO.:  [id number]   MEDICAL RECORD NO.:  [id number]      DATE OF BIRTH:  [birth year]   PHYSICIAN:  Jake Shark A. Tanda Rockers, M.D. VISIT DATE:  02/11/2006                                     OFFICE VISIT   VITAL SIGNS:  Blood pressure is 150/80, respirations 20, pulse rate 72 and  he is afebrile.   PURPOSE OF TODAY'S VISIT:  Mr. Scibilia is seen in follow up for bilateral  ulcerations of his lower extremity.  In the interim we treated him with  tacrolimus, trademark Protopic, 0.1%.  He denies pain, fever or excessive  swelling.   WOUND EXAM:  Inspection of the lower extremity shows that the wounds have  significantly improved.  While they remain hyperemic, there are no open  ulcers and the hyperemia is resolving somewhat.   WOUND SINCE LAST VISIT:   CHANGE IN INTERVAL MEDICAL HISTORY:   DIAGNOSIS:  Assessment is improved wound, most likely related to the  Protopic use.   TREATMENT:   ANESTHETIC USED:   TISSUE DEBRIDED:   LEVEL:   CHANGE IN MEDS:   COMPRESSION BANDAGE:   OTHER:   MANAGEMENT PLAN & GOAL:  We have given the patient a prescription for 30  grams of Protopic 0.1% to use b.i.d.  We have counseled him regarding the  precautions of this medication.  He seems to understand.  We have strongly  encouraged him to have the pharmacist also review the application procedures  and precautions also.  We will see the patient in one week.          ______________________________  Theresia Majors. Tanda Rockers, M.D.    [name]  D:  02/11/2006  T:  02/11/2006  Job:  [id number]

## 2010-10-31 NOTE — Assessment & Plan Note (Signed)
[facility]   NAME:  DENNARD, VEZINA               ACCOUNT NO.:  [id number]   MEDICAL RECORD NO.:  [id number]      DATE OF BIRTH:  [birth year]   PHYSICIAN:  Jonelle Sports. Sevier, M.D.  VISIT DATE:  12/15/2005                                     OFFICE VISIT   VITAL SIGNS:  Blood pressure 138/88, pulse 58 regular, respirations 20,  temperature 98.2.   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  He  no longer continues to have evidence of MRSA present and has not been  recently on any antibiotic regimen.   WOUND EXAM:  The total wounded area on the right lower extremity measures 25  x 3.8-cm but most of the background looks quite healthy and there simply is  numerous scattered areas of superficial ulceration with weeping and minimal  bleeding.  The left lower extremity  is similar in gross appearance with the  total wounded area measuring 12 x 8.4-cm.   WOUND SINCE LAST VISIT:  His extremities have, unfortunately, not healed but  have shown gradual progress.  Most recently, he has been treated with  applications of nystatin powder, absorptive dressings, and bilateral Profore  wraps.  The patient reports no awareness of increased drainage, no odor, no  fever or systemic symptoms since last visit.  He has had no change in  medications.   CHANGE IN INTERVAL MEDICAL HISTORY:   DIAGNOSIS:  Slow but at this point satisfactory course of the bilateral  lower extremity wounds.   TREATMENT:  Both legs are partial thickness debrided of some loose skin and  crust.  Both are then treated with application of nystatin powder, covered  with Telfa pads for non-stick properties, then with an absorptive pad, and  both extremities are placed  in Profore light wraps.   ANESTHETIC USED:   TISSUE DEBRIDED:  Both legs are partial thickness debrided.   LEVEL:   CHANGE IN MEDS:   COMPRESSION BANDAGE:   OTHER:   MANAGEMENT PLAN & GOAL:  Followup visit is scheduled for 1 week.           ______________________________  Jonelle Sports. Cheryll Cockayne, M.D.     RES/MEDQ  D:  12/15/2005  T:  12/15/2005  Job:  [id number]

## 2010-11-26 ENCOUNTER — Other Ambulatory Visit: Payer: Self-pay | Admitting: Orthopedic Surgery

## 2010-11-26 DIAGNOSIS — M542 Cervicalgia: Secondary | ICD-10-CM

## 2010-11-27 ENCOUNTER — Ambulatory Visit
Admission: RE | Admit: 2010-11-27 | Discharge: 2010-11-27 | Disposition: A | Discharge status: Home / Self Care | Payer: Medicare Other | Source: Ambulatory Visit | Attending: Orthopedic Surgery | Admitting: Orthopedic Surgery

## 2010-11-27 DIAGNOSIS — M542 Cervicalgia: Secondary | ICD-10-CM

## 2011-03-26 LAB — COMPREHENSIVE METABOLIC PANEL
ALT: 26
AST: 23
Albumin: 3.3 — ABNORMAL LOW
Alkaline Phosphatase: 63
Potassium: 4.2
Sodium: 138
Total Protein: 6.4

## 2011-03-26 LAB — DIFFERENTIAL
Basophils Absolute: 0
Eosinophils Relative: 4
Lymphocytes Relative: 26
Monocytes Absolute: 0.7

## 2011-03-26 LAB — I-STAT 8, (EC8 V) (CONVERTED LAB)
BUN: 18
Glucose, Bld: 204 — ABNORMAL HIGH
Hemoglobin: 15.3
Potassium: 4.2
Sodium: 135

## 2011-03-26 LAB — CBC
HCT: 43.9
Hemoglobin: 15.1
MCV: 92.1
RDW: 13.6

## 2011-04-13 ENCOUNTER — Ambulatory Visit (INDEPENDENT_AMBULATORY_CARE_PROVIDER_SITE_OTHER): Payer: Medicare Other | Admitting: Surgery

## 2011-04-13 ENCOUNTER — Encounter (INDEPENDENT_AMBULATORY_CARE_PROVIDER_SITE_OTHER): Payer: Self-pay | Admitting: Surgery

## 2011-04-13 VITALS — BP 150/84 | HR 60 | Temp 97.6°F | Resp 20 | Ht 72.0 in | Wt 280.4 lb

## 2011-04-13 DIAGNOSIS — L723 Sebaceous cyst: Secondary | ICD-10-CM

## 2011-04-13 DIAGNOSIS — L089 Local infection of the skin and subcutaneous tissue, unspecified: Secondary | ICD-10-CM | POA: Insufficient documentation

## 2011-04-13 MED ORDER — HYDROCODONE-ACETAMINOPHEN 5-325 MG PO TABS: 1.0000 | ORAL_TABLET | Freq: Four times a day (QID) | ORAL | Status: AC | PRN

## 2011-04-13 NOTE — Patient Instructions (Signed)
Dressing Change Dressings are placed over wounds to keep them clean, dry, and protected from further injury. This provides an environment that favors wound healing. Good wound care includes resting and elevating the injured part until the pain and swelling are better. Change your wound dressing as recommended by your caregiver. When removing an old dressing, lift it slowly away from the wound. If the dressing sticks to the wound, dampen it with half-strength peroxide or tap water. Clean the wound gently with a moist cloth, remove any loose material, and apply antibiotic ointment if recommended by your caregiver. Usually it is okay for a wound to get wet. Wash it with mildly soapy water. Watch for signs of infection when changing a dressing. SEEK MEDICAL CARE IF:  You develop increased pain, redness, or swelling.   You have pus-like drainage from the wound.   You develop a fever greater than 100.4 F (38 C).   Epidermal Cyst An epidermal cyst is sometimes called a sebaceous cyst, epidermal inclusion cyst, or infundibular cyst. These cysts usually contain a substance that looks "pasty" or "cheesy" and may have a bad smell. This substance is a protein called keratin. Epidermal cysts are usually found on the face, neck, or trunk. They may also occur in the vaginal area or other parts of the genitalia of both men and women. Epidermal cysts are usually small, painless, slow-growing bumps or lumps that move freely under the skin. It is important not to try to pop them. This may cause an infection and lead to tenderness and swelling. CAUSES  Epidermal cysts may be caused by a deep penetrating injury to the skin or a plugged hair follicle, often associated with acne. SYMPTOMS  Epidermal cysts can become inflamed and cause:  Redness.   Tenderness.   Increased temperature of the skin over the bumps or lumps.   Grayish-white, bad smelling material that drains from the bump or lump.  DIAGNOSIS    Epidermal cysts are easily diagnosed by your caregiver during an exam. Rarely, a tissue sample (biopsy) may be taken to rule out other conditions that may resemble epidermal cysts. TREATMENT   Epidermal cysts often get better and disappear on their own. They are rarely ever cancerous.   If a cyst becomes infected, it may become inflamed and tender. This may require opening and draining the cyst. Treatment with antibiotics may be necessary. When the infection is gone, the cyst may be removed with minor surgery.   Small, inflamed cysts can often be treated with antibiotics or by injecting steroid medicines.   Sometimes, epidermal cysts become large and bothersome. If this happens, surgical removal in your caregiver's office may be necessary.  HOME CARE INSTRUCTIONS  Only take over-the-counter or prescription medicines as directed by your caregiver.   Take your antibiotics as directed. Finish them even if you start to feel better.  SEEK MEDICAL CARE IF:   Your cyst becomes tender, red, or swollen.   Your condition is not improving or is getting worse.   You have any other questions or concerns.  MAKE SURE YOU:  Understand these instructions.   Will watch your condition.   Will get help right away if you are not doing well or get worse.  Document Released: 05/02/2004 Document Revised: 02/11/2011 Document Reviewed: 12/08/2010 [facility] Patient Information 2012 [place], [place].

## 2011-04-13 NOTE — Progress Notes (Signed)
Subjective:     Patient ID: [name], male   DOB: 1946/05/03, 65 y.o.   MRN: [id number]  HPI  Patient Care Team: [name], MD as PCP - General (Internal Medicine)  This patient is a 65 y.o.male who presents today for surgical evaluation.   Reason for visit: Painful lump on the back of neck.  Patient is a pleasant male with a history of prior skin infections that have been lanced and packed in the past. He fell a lump in the back of his neck last week. He became more tender. He saw his primary care physician today. Dr. Jarold Motto wrote prescription for antibiotic pills and recommend surgical evaluation. The patient denies any recent sick contacts or travel history. No history of falls.  Past Medical History  Diagnosis Date  . Neck abscess   . Diabetes mellitus   . Hypertension     History reviewed. No pertinent past surgical history.  History   Social History  . Marital Status: Married    Spouse Name: N/A    Number of Children: N/A  . Years of Education: N/A   Occupational History  . Not on file.   Social History Main Topics  . Smoking status: Never Smoker   . Smokeless tobacco: Never Used  . Alcohol Use: No  . Drug Use: No  . Sexually Active:    Other Topics Concern  . Not on file   Social History Narrative  . No narrative on file    History reviewed. No pertinent family history.  Current outpatient prescriptions:DOXYCYCLINE, ROSACEA, PO, Take by mouth daily.  , Disp: , Rfl: ;  INSULIN REGULAR HUMAN IJ, Inject as directed. Pt uses 70-30 on a sliding scale based on sugar levels , Disp: , Rfl: ;  Levothyroxine Sodium (SYNTHROID PO), Take by mouth daily.  , Disp: , Rfl: ;  Multiple Vitamins-Minerals (CENTRUM PO), Take by mouth daily.  , Disp: , Rfl: ;  Olmesartan Medoxomil (BENICAR PO), Take by mouth daily.  , Disp: , Rfl:  HYDROcodone-acetaminophen (NORCO) 5-325 MG per tablet, Take 1-2 tablets by mouth every 6 (six) hours as needed for pain., Disp: 30  tablet, Rfl: 1  Allergies  Allergen Reactions  . Penicillins     REACTION: swollen tongue and eyes       Review of Systems  Constitutional: Negative for fever, chills and diaphoresis.  HENT: Negative for sore throat, trouble swallowing and neck pain.   Eyes: Negative for photophobia and visual disturbance.  Respiratory: Negative for choking and shortness of breath.   Cardiovascular: Negative for chest pain and palpitations.  Gastrointestinal: Negative for nausea, vomiting, abdominal distention, anal bleeding and rectal pain.  Genitourinary: Negative for dysuria, urgency, difficulty urinating and testicular pain.  Musculoskeletal: Negative for myalgias, arthralgias and gait problem.  Skin: Negative for color change, rash and wound.  Neurological: Negative for dizziness, speech difficulty, weakness and numbness.  Hematological: Negative for adenopathy.  Psychiatric/Behavioral: Negative for hallucinations, confusion and agitation.       Objective:   Physical Exam  Constitutional: He is oriented to person, place, and time. He appears well-developed and well-nourished. No distress.  HENT:  Head: Normocephalic.  Mouth/Throat: Oropharynx is clear and moist. No oropharyngeal exudate.  Eyes: Conjunctivae and EOM are normal. Pupils are equal, round, and reactive to light. No scleral icterus.  Neck: Normal range of motion. No tracheal deviation present.         The pathophysiology of subcutaneous abscess and differential diagnosis  was discussed.  Natural history progression was discussed.  The patient's symptoms are not adequately controlled.  Non-operative treatment has not healed the abscess.  Therefore, I recommended incision & drainage of the abscess to allow the infection to resolve and heal.  Technique, risks, benefits, alternatives discussed.  The patient expressed understanding & wished to proceed.  I placed a field block with local anaesthetic.  I incised the skin over the  infected cyst & removed the cyst.  There was spillage of some purulence but I was able to remove it intact.  I partially closed the wound w 3-0 vicryl x 2 deep dermal.  I packed the wound with 1/4 ribbon NU-Gauze.    The patient tolerated the procedure.  We will have the patient return to clinic for close follow up to make sure the infection heals.     Cardiovascular: Normal rate, normal heart sounds and intact distal pulses.   Pulmonary/Chest: Effort normal. No respiratory distress.  Abdominal: Soft. He exhibits no distension. There is no tenderness. Hernia confirmed negative in the right inguinal area and confirmed negative in the left inguinal area.       Incisions clean with normal healing ridges.  No hernias  Musculoskeletal: Normal range of motion. He exhibits no tenderness.  Neurological: He is alert and oriented to person, place, and time. No cranial nerve deficit. He exhibits normal muscle tone. Coordination normal.  Skin: Skin is warm and dry. No rash noted. He is not diaphoretic.  Psychiatric: He has a normal mood and affect. His behavior is normal.       Assessment:     Inflammed/infected seb cyst of neck s/p removal & partial closure    Plan:     Start antibiotics.  Dressing change BID to outside of wound.  Leave wick in wound (will probably fall out in 2-3 days).  Increase activity as tolerated.  Do not push through pain.  Return to clinic ~ 2weeks. Handouts given.  The patient expressed understanding and appreciation

## 2011-04-21 ENCOUNTER — Ambulatory Visit (INDEPENDENT_AMBULATORY_CARE_PROVIDER_SITE_OTHER): Payer: Medicare Other | Admitting: General Surgery

## 2011-04-21 ENCOUNTER — Encounter (INDEPENDENT_AMBULATORY_CARE_PROVIDER_SITE_OTHER): Payer: Self-pay | Admitting: General Surgery

## 2011-04-21 VITALS — BP 128/66 | HR 64 | Temp 97.6°F | Resp 16 | Ht 72.0 in | Wt 278.4 lb

## 2011-04-21 DIAGNOSIS — L089 Local infection of the skin and subcutaneous tissue, unspecified: Secondary | ICD-10-CM

## 2011-04-21 DIAGNOSIS — L723 Sebaceous cyst: Secondary | ICD-10-CM

## 2011-04-21 NOTE — Progress Notes (Signed)
HISTORY: Pt doing better s/p I&D posterior neck abscess.  The first 24 hours were rough, but after that, he has been feeling significantly improved.  He is no longer having any significant soreness.  He has been changing the outer dressing as needed.     PERTINENT REVIEW OF SYSTEMS: O/w negative.     EXAM: Head: Normocephalic and atraumatic.  Eyes:  Conjunctivae are normal. Pupils are equal, round, and reactive to light. No scleral icterus.  Neck:  Normal range of motion. Neck supple. Posterior wound without cellulitis.  Wicks removed.  No foul smelling drainage.    Resp: No respiratory distress, normal effort. Neurological: Alert and oriented to person, place, and time. Using cane.    Skin: Skin is warm and dry. No rash noted. No diaphoretic. No erythema. No pallor.  Psychiatric: Normal mood and affect. Normal behavior. Judgment and thought content normal.    ASSESSMENT AND PLAN:   Infected sebaceous cyst, neck 4x4x3cm Cellulitis resolved. Wicks removed. Finish antibiotic course. Follow up as needed with Dr. Michaell Cowing. Wash wound daily.       Maudry Diego, MD Surgical Oncology, General & Endocrine Surgery [facility]  [name], MD, MD [name], MD

## 2011-04-21 NOTE — Patient Instructions (Signed)
Change dressing as needed. Remove dressing daily and shower with soapy water.   Finish antibiotics.

## 2011-04-21 NOTE — Assessment & Plan Note (Signed)
Cellulitis resolved. Wicks removed. Finish antibiotic course. Follow up as needed with Dr. Michaell Cowing. Wash wound daily.

## 2011-06-19 ENCOUNTER — Encounter (HOSPITAL_BASED_OUTPATIENT_CLINIC_OR_DEPARTMENT_OTHER): Payer: Medicare Other | Attending: General Surgery

## 2011-06-19 DIAGNOSIS — E669 Obesity, unspecified: Secondary | ICD-10-CM | POA: Insufficient documentation

## 2011-06-19 DIAGNOSIS — J449 Chronic obstructive pulmonary disease, unspecified: Secondary | ICD-10-CM | POA: Insufficient documentation

## 2011-06-19 DIAGNOSIS — I10 Essential (primary) hypertension: Secondary | ICD-10-CM | POA: Insufficient documentation

## 2011-06-19 DIAGNOSIS — G609 Hereditary and idiopathic neuropathy, unspecified: Secondary | ICD-10-CM | POA: Insufficient documentation

## 2011-06-19 DIAGNOSIS — I509 Heart failure, unspecified: Secondary | ICD-10-CM | POA: Insufficient documentation

## 2011-06-19 DIAGNOSIS — J4489 Other specified chronic obstructive pulmonary disease: Secondary | ICD-10-CM | POA: Insufficient documentation

## 2011-06-19 DIAGNOSIS — Z794 Long term (current) use of insulin: Secondary | ICD-10-CM | POA: Insufficient documentation

## 2011-06-19 DIAGNOSIS — E119 Type 2 diabetes mellitus without complications: Secondary | ICD-10-CM | POA: Insufficient documentation

## 2011-06-19 DIAGNOSIS — L89899 Pressure ulcer of other site, unspecified stage: Secondary | ICD-10-CM | POA: Insufficient documentation

## 2011-06-19 DIAGNOSIS — L8990 Pressure ulcer of unspecified site, unspecified stage: Secondary | ICD-10-CM | POA: Insufficient documentation

## 2011-06-19 DIAGNOSIS — Z79899 Other long term (current) drug therapy: Secondary | ICD-10-CM | POA: Insufficient documentation

## 2011-06-19 DIAGNOSIS — H409 Unspecified glaucoma: Secondary | ICD-10-CM | POA: Insufficient documentation

## 2011-06-19 NOTE — Progress Notes (Signed)
[facility]  NAME:  [name], [name] NO.:  [id number]  MEDICAL RECORD NO.:  [id number]      DATE OF BIRTH:  Oct 28, 1945  PHYSICIAN:  Ardath Sax, M.D.           VISIT DATE:                                  OFFICE VISIT   [name] is a moderately obese 66 year old gentleman who has had diabetes for about 20 years, and for that he controls it with 70/30 insulin.  He also has a history of chronic congestive heart failure, chronic obstructive pulmonary disease, glaucoma, hypertension, and peripheral neuropathy.  MEDICATIONS: 1. Neurontin several times a day. 2. Norvasc 5 mg every day. 3. His insulin he uses 70/30. 4. He is also on Benicar and Synthroid, and his doctor has put him on     doxycycline.  He states that he has had problems with having some sort of ulcers presumably venous stasis ulcers on both of his legs.  He has got very thin and tenuous skin, and he enters today because of about 4 x 4 cm what looks to be a pressure sore on the lateral side of his right calf. We debrided it of the dead skin and then we will treat him with Santyl at this time, and will continue the doxycycline.  I think this probably started from him sleeping on it over a prolonged period and that the blood supply was cut short.  It looks very much like a pressure sore type ulcer.  We debrided all of the dead material right down to the subcutaneous tissue, and as I said, we will treat it with Santyl and continue the doxycycline.  He will come back in a week.     Ardath Sax, M.D.     PP/MEDQ  D:  06/19/2011  T:  06/19/2011  Job:  [id number]

## 2011-07-03 ENCOUNTER — Emergency Department (HOSPITAL_COMMUNITY)
Admission: EM | Admit: 2011-07-03 | Discharge: 2011-07-03 | Disposition: A | Discharge status: Home / Self Care | Payer: Medicare Other | Attending: Emergency Medicine | Admitting: Emergency Medicine

## 2011-07-03 ENCOUNTER — Encounter (HOSPITAL_COMMUNITY): Payer: Self-pay | Admitting: Emergency Medicine

## 2011-07-03 DIAGNOSIS — Z09 Encounter for follow-up examination after completed treatment for conditions other than malignant neoplasm: Secondary | ICD-10-CM | POA: Insufficient documentation

## 2011-07-03 DIAGNOSIS — Z8614 Personal history of Methicillin resistant Staphylococcus aureus infection: Secondary | ICD-10-CM | POA: Insufficient documentation

## 2011-07-03 DIAGNOSIS — E119 Type 2 diabetes mellitus without complications: Secondary | ICD-10-CM | POA: Insufficient documentation

## 2011-07-03 DIAGNOSIS — I10 Essential (primary) hypertension: Secondary | ICD-10-CM | POA: Insufficient documentation

## 2011-07-03 DIAGNOSIS — Z794 Long term (current) use of insulin: Secondary | ICD-10-CM | POA: Insufficient documentation

## 2011-07-03 DIAGNOSIS — L97909 Non-pressure chronic ulcer of unspecified part of unspecified lower leg with unspecified severity: Secondary | ICD-10-CM | POA: Insufficient documentation

## 2011-07-03 MED ORDER — SILVER SULFADIAZINE 1 % EX CREA
TOPICAL_CREAM | Freq: Once | CUTANEOUS | Status: AC
  Administered 2011-07-03: 17:00:00 via TOPICAL
  Filled 2011-07-03: qty 50

## 2011-07-03 NOTE — ED Provider Notes (Signed)
3:20 PM  I performed a history and physical examination of [name] and discussed his management with Rhea Bleacher PA.  I agree with the history, physical, assessment, and plan of care, with the following exceptions: None  The patient is a 66 year old male who presents to the ED for a dressing change as the wound clinic was closed when he arrived today. He has an approximately 4 cm diameter diabetic ulcer on the lateral right calf with healthy-appearing granulation tissue and edges. He has intact pulse, movement, and sensation distally at the foot. There is no apparent infection of the wound. We will provide a Silvadene dressing with Telfa pad over top and gauze and compression bandage. It appears that he had been getting an Unna dressing which were unable to provide from the ED, so we will attempt to get maximum benefit from the dressing were able to apply. The patient will followup with his wound care clinic next week.  I was present for the following procedures: None Time Spent in Critical Care of the patient: None Time spent in discussions with the patient and family: 5 minutes  Crimson Dubberly D    Felisa Bonier, MD 07/03/11 [phone]

## 2011-07-03 NOTE — ED Provider Notes (Signed)
History     CSN: [id number]  Arrival date & time 07/03/11  1415   First MD Initiated Contact with Patient 07/03/11 1500      Chief Complaint  Patient presents with  . Dressing Change    (Consider location/radiation/quality/duration/timing/severity/associated sxs/prior treatment) HPI Comments: Patient with history of diabetes presents with three-week history of right anterior lower extremity wound. Patient is followed at wound care center. Today would be his third appointment however when he arrived at the office today was closed. Patient reports draining from the wound and through the bandages for the past several days. Patient is requesting re-dressing. Patient denies worsening pain or fever. No nausea or vomiting or other medical complaints.  Patient is a 66 y.o. male presenting with wound check. The history is provided by the patient.  Wound Check     Past Medical History  Diagnosis Date  . Neck abscess   . Diabetes mellitus   . Hypertension   . MRSA (methicillin resistant Staphylococcus aureus)     Past Surgical History  Procedure Date  . Back surgery   . Abscess drainage     Family History  Problem Relation Age of Onset  . Heart disease Mother   . Heart disease Father     History  Substance Use Topics  . Smoking status: Former Games developer  . Smokeless tobacco: Never Used  . Alcohol Use: No      Review of Systems  Constitutional: Negative for fever and chills.  Gastrointestinal: Negative for nausea and vomiting.  Musculoskeletal: Negative for myalgias.  Skin: Positive for wound. Negative for color change and rash.    Allergies  Penicillins  Home Medications   Current Outpatient Rx  Name Route Sig Dispense Refill  . DORZOLAMIDE HCL 2 % OP SOLN Both Eyes Place 1 drop into both eyes 2 (two) times daily.     Marland Kitchen DOXYCYCLINE HYCLATE 100 MG PO TABS Oral Take 100 mg by mouth 2 (two) times daily. Course of treatment not completed.    Marland Kitchen LEVOTHYROXINE SODIUM 100  MCG PO TABS Oral Take 100 mcg by mouth daily.    . ADULT MULTIVITAMIN W/MINERALS CH Oral Take 1 tablet by mouth daily.    Marland Kitchen NOVOLIN 70/30 (70-30) 100 UNIT/ML S[place]ent SUSP Subcutaneous Inject into the skin 3 (three) times daily. According to sliding scale.    Marland Kitchen OLMESARTAN MEDOXOMIL 40 MG PO TABS Oral Take 40 mg by mouth daily.    Marland Kitchen OMEPRAZOLE 20 MG PO CPDR Oral Take 20 mg by mouth daily.     Marland Kitchen SILVER SULFADIAZINE 1 % EX CREA Topical Apply 1 application topically daily. Applied to leg wound.    Marland Kitchen BAYER CONTOUR TEST VI STRP        BP 127/92  Pulse 108  Resp 20  SpO2 97%  Physical Exam  Nursing note and vitals reviewed. Constitutional: He is oriented to person, place, and time. He appears well-developed and well-nourished.  HENT:  Head: Normocephalic and atraumatic.  Eyes: Conjunctivae are normal.  Neck: Normal range of motion. Neck supple.  Musculoskeletal: He exhibits no edema and no tenderness.       Patient with 2-3 cm, open, healing wound on the right anterior shin just lateral to midline. Surrounding skin is intact. There is moderate amount of granulation tissue within the wound. Wound borders are pink. There is moderate amount of crusted discharge.  Neurological: He is alert and oriented to person, place, and time.  Skin: Skin is warm and dry.  Psychiatric: He has a normal mood and affect.    ED Course  Procedures (including critical care time)  Labs Reviewed - No data to display No results found.   1. Lower extremity ulceration     3:28 PM Patient seen and examined.  3:28 PM Patient was discussed with Felisa Bonier, MD Patient seen with Dr. Fredricka Bonine. Will apply Silvadene dressing, cover with Telfa bandage, roller bandage, and Coban. Urged patient to call for appointment with wound care. Patient verbalizes understanding and agrees with plan. Urged to contact wound care center with worsening drainage or pain.  5:02 PM bandage placed by nurse. Patient reexamined. Bandage in  place. Questions answered. Patient appears well and is stable for discharge home.  MDM  Patient with ulceration 2/2 DM. Wound is healing appropriately. No evidence of infection. Wound redressed.     Eustace Moore [place], [place] 07/03/11 [phone]

## 2011-07-03 NOTE — ED Notes (Signed)
Per pt., wound care center closed-right leg wound-dressing needs changing

## 2011-07-15 NOTE — ED Provider Notes (Signed)
Evaluation and management procedures were performed by the PA/NP under my supervision/collaboration.    Felisa Bonier, MD 07/15/11 1900

## 2011-07-17 ENCOUNTER — Encounter (HOSPITAL_BASED_OUTPATIENT_CLINIC_OR_DEPARTMENT_OTHER): Payer: Medicare Other | Attending: General Surgery

## 2011-07-17 DIAGNOSIS — Z79899 Other long term (current) drug therapy: Secondary | ICD-10-CM | POA: Insufficient documentation

## 2011-07-17 DIAGNOSIS — I509 Heart failure, unspecified: Secondary | ICD-10-CM | POA: Insufficient documentation

## 2011-07-17 DIAGNOSIS — Z794 Long term (current) use of insulin: Secondary | ICD-10-CM | POA: Insufficient documentation

## 2011-07-17 DIAGNOSIS — J449 Chronic obstructive pulmonary disease, unspecified: Secondary | ICD-10-CM | POA: Insufficient documentation

## 2011-07-17 DIAGNOSIS — E669 Obesity, unspecified: Secondary | ICD-10-CM | POA: Insufficient documentation

## 2011-07-17 DIAGNOSIS — L8990 Pressure ulcer of unspecified site, unspecified stage: Secondary | ICD-10-CM | POA: Insufficient documentation

## 2011-07-17 DIAGNOSIS — J4489 Other specified chronic obstructive pulmonary disease: Secondary | ICD-10-CM | POA: Insufficient documentation

## 2011-07-17 DIAGNOSIS — H409 Unspecified glaucoma: Secondary | ICD-10-CM | POA: Insufficient documentation

## 2011-07-17 DIAGNOSIS — G609 Hereditary and idiopathic neuropathy, unspecified: Secondary | ICD-10-CM | POA: Insufficient documentation

## 2011-07-17 DIAGNOSIS — I10 Essential (primary) hypertension: Secondary | ICD-10-CM | POA: Insufficient documentation

## 2011-07-17 DIAGNOSIS — L89899 Pressure ulcer of other site, unspecified stage: Secondary | ICD-10-CM | POA: Insufficient documentation

## 2011-07-17 DIAGNOSIS — E119 Type 2 diabetes mellitus without complications: Secondary | ICD-10-CM | POA: Insufficient documentation

## 2011-08-14 ENCOUNTER — Encounter (HOSPITAL_BASED_OUTPATIENT_CLINIC_OR_DEPARTMENT_OTHER): Payer: Medicare Other | Attending: General Surgery

## 2011-08-14 DIAGNOSIS — E039 Hypothyroidism, unspecified: Secondary | ICD-10-CM | POA: Insufficient documentation

## 2011-08-14 DIAGNOSIS — Z79899 Other long term (current) drug therapy: Secondary | ICD-10-CM | POA: Insufficient documentation

## 2011-08-14 DIAGNOSIS — I872 Venous insufficiency (chronic) (peripheral): Secondary | ICD-10-CM | POA: Insufficient documentation

## 2011-08-14 DIAGNOSIS — E119 Type 2 diabetes mellitus without complications: Secondary | ICD-10-CM | POA: Insufficient documentation

## 2011-08-14 DIAGNOSIS — L97809 Non-pressure chronic ulcer of other part of unspecified lower leg with unspecified severity: Secondary | ICD-10-CM | POA: Insufficient documentation

## 2011-08-14 DIAGNOSIS — I10 Essential (primary) hypertension: Secondary | ICD-10-CM | POA: Insufficient documentation

## 2011-09-18 ENCOUNTER — Encounter (HOSPITAL_BASED_OUTPATIENT_CLINIC_OR_DEPARTMENT_OTHER): Payer: Medicare Other | Attending: General Surgery

## 2011-09-18 DIAGNOSIS — Z79899 Other long term (current) drug therapy: Secondary | ICD-10-CM | POA: Insufficient documentation

## 2011-09-18 DIAGNOSIS — I10 Essential (primary) hypertension: Secondary | ICD-10-CM | POA: Insufficient documentation

## 2011-09-18 DIAGNOSIS — E039 Hypothyroidism, unspecified: Secondary | ICD-10-CM | POA: Insufficient documentation

## 2011-09-18 DIAGNOSIS — I872 Venous insufficiency (chronic) (peripheral): Secondary | ICD-10-CM | POA: Insufficient documentation

## 2011-09-18 DIAGNOSIS — E119 Type 2 diabetes mellitus without complications: Secondary | ICD-10-CM | POA: Insufficient documentation

## 2011-09-18 DIAGNOSIS — L97809 Non-pressure chronic ulcer of other part of unspecified lower leg with unspecified severity: Secondary | ICD-10-CM | POA: Insufficient documentation

## 2013-05-03 ENCOUNTER — Encounter: Payer: Self-pay | Admitting: Cardiology

## 2013-05-03 DIAGNOSIS — A4902 Methicillin resistant Staphylococcus aureus infection, unspecified site: Secondary | ICD-10-CM | POA: Insufficient documentation

## 2013-05-03 DIAGNOSIS — I10 Essential (primary) hypertension: Secondary | ICD-10-CM | POA: Insufficient documentation

## 2013-05-03 DIAGNOSIS — L0211 Cutaneous abscess of neck: Secondary | ICD-10-CM | POA: Insufficient documentation

## 2013-05-05 ENCOUNTER — Encounter: Payer: Self-pay | Admitting: Cardiology

## 2013-05-05 ENCOUNTER — Encounter: Payer: Self-pay | Admitting: General Surgery

## 2013-05-05 ENCOUNTER — Ambulatory Visit (INDEPENDENT_AMBULATORY_CARE_PROVIDER_SITE_OTHER): Payer: Medicare Other | Admitting: Cardiology

## 2013-05-05 VITALS — BP 108/55 | HR 61 | Ht 72.0 in | Wt 285.0 lb

## 2013-05-05 DIAGNOSIS — G4733 Obstructive sleep apnea (adult) (pediatric): Secondary | ICD-10-CM

## 2013-05-05 DIAGNOSIS — R011 Cardiac murmur, unspecified: Secondary | ICD-10-CM

## 2013-05-05 DIAGNOSIS — R0609 Other forms of dyspnea: Secondary | ICD-10-CM | POA: Insufficient documentation

## 2013-05-05 DIAGNOSIS — I2510 Atherosclerotic heart disease of native coronary artery without angina pectoris: Secondary | ICD-10-CM

## 2013-05-05 DIAGNOSIS — I10 Essential (primary) hypertension: Secondary | ICD-10-CM

## 2013-05-05 NOTE — Progress Notes (Signed)
[address] [place], [place]  [address] Phone: [phone] Fax:  [phone]  Date:  05/05/2013   ID:  [name], DOB 1945-08-23, MRN [id number]  PCP:  [name], MD  Cardiologist:  Armanda Magic, MD    History of Present Illness: [name] is a 67 y.o. male with a history of DM, HTN, obesity and OSA who presents today for SOB and DOE.  He has noticed a lot of SOB with exertion especially taking the trash out.  He denies any exertional chest pain or pressure. He says that he has had SOB for years but thinks that it is stable.   He has had sleep apnea for 19 years and has never had a new CPAP unit.  He would like to get a new one.  He uses a full face mask and would like a new mask as well.  He uses [facility] for his DME.  He feels rested when he gets up in the am and does nap some in the afternoon.  He is up a lot a night urinating.  He has chronic LE edema which is controlled on diuretics.  His PCP has referred him for evaluation for ASCAD.     Wt Readings from Last 3 Encounters:  05/05/13 285 lb (129.275 kg)  04/21/11 278 lb 6.4 oz (126.281 kg)  04/13/11 280 lb 6 oz (127.177 kg)     Past Medical History  Diagnosis Date  . Neck abscess   . Diabetes mellitus   . Hypertension   . MRSA (methicillin resistant Staphylococcus aureus)   . 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 amLODipine (NORVASC) 5 MG tablet       . BAYER CONTOUR TEST test strip       . dorzolamide (TRUSOPT) 2 % ophthalmic solution Place 1 drop into both eyes 2 (two) times daily.       . furosemide (LASIX) 40 MG tablet Take 40 mg by mouth daily.       Marland Kitchen levothyroxine (SYNTHROID, LEVOTHROID) 100 MCG tablet Take 100 mcg by mouth daily.      . Multiple Vitamin (MULITIVITAMIN WITH MINERALS) TABS Take 1 tablet by mouth daily.      . nitroGLYCERIN (NITRODUR - DOSED IN MG/24 HR) 0.2 mg/hr  patch Place 0.2 mg onto the skin daily.       Marland Kitchen NOVOLIN 70/30 (70-30) 100 UNIT/ML injection Inject into the skin 3 (three) times daily. According to sliding scale.      . olmesartan (BENICAR) 40 MG tablet Take 40 mg by mouth daily. 1/2 tab      . omeprazole (PRILOSEC) 20 MG capsule Take 20 mg by mouth daily.        No current facility-administered medications for this visit.    Allergies:    Allergies  Allergen Reactions  . Penicillins     REACTION: swollen tongue and eyes    Social History:  The patient  reports that he has quit smoking. He has never used smokeless tobacco. He reports that he does not drink alcohol or use illicit drugs.   Family History:  The patient's family history includes Heart disease in his father and mother.   ROS:  Please see the history of present illness.      All other systems reviewed and negative.   PHYSICAL EXAM: VS:  BP 108/55  Pulse 61  Ht 6' (1.829 m)  Wt 285 lb (129.275 kg)  BMI 38.64 kg/m2 Well nourished, well developed, in no acute distress HEENT: normal Neck: no JVD Cardiac:  normal S1, S2; RRR; 2/6 SM at LLSB and occasional ectopy Lungs:  clear to auscultation bilaterally, no wheezing, rhonchi or rales Abd: soft, nontender, no hepatomegaly Ext: no edema Skin: warm and dry Neuro:  CNs 2-12 intact, no focal abnormalities noted  EKG:     NSR with no ST changes  ASSESSMENT AND PLAN:  1. 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  CRF include DM/HTN/obesity/age and male sex. 2. Chronic DOE - most likely secondary to morbid obesity  - I will get a 2 day Lexiscan myoview to rule out ischemia 3.   Morbid obesity 4.  Obstructive sleep apnea - he would like a new machine since his is 67 years old  - I will order a split night PSG for insurance to pay for new machine 5.   HTN - well controlled  - continue Benicar/amlodipine 6.  Heart murmur  - check 2D  echo  Followup with me after sleep study  - I will set him up for a split night PSG so insurance will pay for a new CPAP machine  Signed, Armanda Magic, MD 05/05/2013 11:51 AM

## 2013-05-05 NOTE — Patient Instructions (Addendum)
Your physician has requested that you have an echocardiogram. Echocardiography is a painless test that uses sound waves to create images of your heart. It provides your doctor with information about the size and shape of your heart and how well your heart's chambers and valves are working. This procedure takes approximately one hour. There are no restrictions for this procedure.  Your physician has requested that you have a lexiscan myoview. For further information please visit [email or web address] Please follow instruction sheet, as given. (2 day Lexiscan)   Your physician has recommended that you have a sleep study. This test records several body functions during sleep, including: brain activity, eye movement, oxygen and carbon dioxide blood levels, heart rate and rhythm, breathing rate and rhythm, the flow of air through your mouth and nose, snoring, body muscle movements, and chest and belly movement. (This will be done at [facility])

## 2013-05-24 ENCOUNTER — Encounter: Payer: Self-pay | Admitting: Cardiology

## 2013-05-24 ENCOUNTER — Ambulatory Visit (HOSPITAL_COMMUNITY): Payer: Medicare Other | Attending: Cardiology | Admitting: Radiology

## 2013-05-24 ENCOUNTER — Other Ambulatory Visit (HOSPITAL_COMMUNITY): Payer: Medicare Other | Admitting: *Deleted

## 2013-05-24 DIAGNOSIS — R0989 Other specified symptoms and signs involving the circulatory and respiratory systems: Secondary | ICD-10-CM | POA: Insufficient documentation

## 2013-05-24 DIAGNOSIS — R0600 Dyspnea, unspecified: Secondary | ICD-10-CM

## 2013-05-24 DIAGNOSIS — R011 Cardiac murmur, unspecified: Secondary | ICD-10-CM | POA: Insufficient documentation

## 2013-05-24 DIAGNOSIS — I059 Rheumatic mitral valve disease, unspecified: Secondary | ICD-10-CM | POA: Insufficient documentation

## 2013-05-24 DIAGNOSIS — I2510 Atherosclerotic heart disease of native coronary artery without angina pectoris: Secondary | ICD-10-CM | POA: Insufficient documentation

## 2013-05-24 DIAGNOSIS — I10 Essential (primary) hypertension: Secondary | ICD-10-CM | POA: Insufficient documentation

## 2013-05-24 DIAGNOSIS — R0609 Other forms of dyspnea: Secondary | ICD-10-CM | POA: Insufficient documentation

## 2013-05-24 DIAGNOSIS — I421 Obstructive hypertrophic cardiomyopathy: Secondary | ICD-10-CM

## 2013-05-24 DIAGNOSIS — E119 Type 2 diabetes mellitus without complications: Secondary | ICD-10-CM | POA: Insufficient documentation

## 2013-05-24 DIAGNOSIS — R0602 Shortness of breath: Secondary | ICD-10-CM

## 2013-05-24 MED ORDER — PERFLUTREN PROTEIN A MICROSPH IV SUSP
2.0000 mL | Freq: Once | INTRAVENOUS | Status: AC
  Administered 2013-05-24: 2 mL via INTRAVENOUS

## 2013-05-24 NOTE — Progress Notes (Signed)
Echocardiogram performed with optison.  

## 2013-05-29 ENCOUNTER — Encounter: Payer: Self-pay | Admitting: Internal Medicine

## 2013-05-30 ENCOUNTER — Encounter: Payer: Self-pay | Admitting: Cardiology

## 2013-05-30 ENCOUNTER — Ambulatory Visit (HOSPITAL_COMMUNITY): Payer: Medicare Other | Attending: Cardiology | Admitting: Radiology

## 2013-05-30 VITALS — BP 167/93 | Ht 72.0 in | Wt 285.0 lb

## 2013-05-30 DIAGNOSIS — I10 Essential (primary) hypertension: Secondary | ICD-10-CM | POA: Insufficient documentation

## 2013-05-30 DIAGNOSIS — R0602 Shortness of breath: Secondary | ICD-10-CM | POA: Insufficient documentation

## 2013-05-30 DIAGNOSIS — R0609 Other forms of dyspnea: Secondary | ICD-10-CM

## 2013-05-30 DIAGNOSIS — Z794 Long term (current) use of insulin: Secondary | ICD-10-CM | POA: Insufficient documentation

## 2013-05-30 DIAGNOSIS — E119 Type 2 diabetes mellitus without complications: Secondary | ICD-10-CM | POA: Insufficient documentation

## 2013-05-30 DIAGNOSIS — Z87891 Personal history of nicotine dependence: Secondary | ICD-10-CM | POA: Insufficient documentation

## 2013-05-30 DIAGNOSIS — R9431 Abnormal electrocardiogram [ECG] [EKG]: Secondary | ICD-10-CM

## 2013-05-30 DIAGNOSIS — I2510 Atherosclerotic heart disease of native coronary artery without angina pectoris: Secondary | ICD-10-CM

## 2013-05-30 DIAGNOSIS — R0989 Other specified symptoms and signs involving the circulatory and respiratory systems: Secondary | ICD-10-CM | POA: Insufficient documentation

## 2013-05-30 MED ORDER — TECHNETIUM TC 99M SESTAMIBI GENERIC - CARDIOLITE
30.0000 | Freq: Once | INTRAVENOUS | Status: AC | PRN
  Administered 2013-05-30: 30 via INTRAVENOUS

## 2013-05-30 MED ORDER — REGADENOSON 0.4 MG/5ML IV SOLN
0.4000 mg | Freq: Once | INTRAVENOUS | Status: AC
  Administered 2013-05-30: 0.4 mg via INTRAVENOUS

## 2013-05-30 MED ORDER — TECHNETIUM TC 99M SESTAMIBI GENERIC - CARDIOLITE
10.0000 | Freq: Once | INTRAVENOUS | Status: AC | PRN
  Administered 2013-05-30: 10 via INTRAVENOUS

## 2013-05-30 NOTE — Progress Notes (Addendum)
[facility] SITE 3 NUCLEAR MED [address] [place], [place] [address] [phone]    Cardiology Nuclear Med Study  LONZY MATO is a 67 y.o. male     MRN : [id number]     DOB: 1945-12-31  Procedure Date: 05/30/2013  Nuclear Med Background Indication for Stress Test:  Evaluation for Ischemia History:  Non Qwave MI N/O CAD  10/21/05 MPI: EF: 64% Adenosine NL 05/24/13 ECHO  Cardiac Risk Factors: Family History - CAD, History of Smoking, Hypertension and IDDM  Symptoms:  DOE and SOB   Nuclear Pre-Procedure Caffeine/Decaff Intake:  None > 12 hrs NPO After: 6:30pm   Lungs:  clear O2 Sat: 95% on room air. IV 0.9% NS with Angio Cath:  22g  IV Site: R Antecubital x 1, tolerated well IV Started by:  Irean Hong, RN  Chest Size (in):  50 Cup Size: n/a  Height: 6' (1.829 m)  Weight:  285 lb (129.275 kg)  BMI:  Body mass index is 38.64 kg/(m^2). Tech Comments:  Full dose insulin last night; no insulin today. Fasting CBG was 113 at 0900 per patient. Patsy Edwards,RN. 2nd degree AVB  At baseline, DOD K. Nelson consulted. Ok'd to do the test. S.Williams EMTP    Nuclear Med Study 1 or 2 day study: 1 day  Stress Test Type:  Lexiscan  Reading MD: Armanda Magic, MD  Order Authorizing Provider:  Armanda Magic, MD  Resting Radionuclide: Technetium 65m Sestamibi  Resting Radionuclide Dose: 11.0 mCi   Stress Radionuclide:  Technetium 38m Sestamibi  Stress Radionuclide Dose: 33.0 mCi           Stress Protocol Rest HR: 54 Stress HR: 63  Rest BP: 167/93 Stress BP: 149/78  Exercise Time (min): n/a METS: n/a   Predicted Max HR: 151 bpm % Max HR: 41.72 bpm Rate Pressure Product: 21308   Dose of Adenosine (mg):  n/a Dose of Lexiscan: 0.4 mg  Dose of Atropine (mg): n/a Dose of Dobutamine: n/a mcg/kg/min (at max HR)  Stress Test Technologist: Milana Na, EMT-P  Nuclear Technologist:  Harlow Asa, CNMT     Rest Procedure:  Myocardial perfusion imaging was performed at  rest 45 minutes following the intravenous administration of Technetium 58m Sestamibi. Rest ECG: NSR with non-specific ST-T wave changes  Stress Procedure:  The patient received IV Lexiscan 0.4 mg over 15-seconds.  Technetium 68m Sestamibi injected at 30-seconds. This patient had sob, felt weird, and was lt. Headed with the Lexiscan injection.  Quantitative spect images were obtained after a 45 minute delay. Stress ECG: No significant change from baseline ECG.  With Lexiscan there was type I second degree AV block  QPS Raw Data Images:  Mild diaphragmatic attenuation.  Normal left ventricular size. Stress Images:  There is decreased uptake in the inferolateral wall. Rest Images:  There is decreased uptake in the inferolateral wall. Subtraction (SDS):  There is a fixed inferior defect that is most consistent with diaphragmatic attenuation. Transient Ischemic Dilatation (Normal <1.22):  0.97 Lung/Heart Ratio (Normal <0.45):  0.38  Quantitative Gated Spect Images QGS EDV:  NA QGS ESV:  NA  Impression Exercise Capacity:  Lexiscan with low level exercise. BP Response:  Normal blood pressure response. Clinical Symptoms:  There is dyspnea. ECG Impression:  No significant ST segment change suggestive of ischemia.  Second degree type I AV block during Lexiscan infusion Comparison with Prior Nuclear Study: No images to compare  Overall Impression:  Low risk stress nuclear study with  a fixed defect in the inferolateral wall c/w diaphragmatic attenuation.  LV Ejection Fraction: Study not gated.  LV Wall Motion:  NA   Signed:   Armanda Magic, MD 06/02/2013

## 2013-05-31 ENCOUNTER — Other Ambulatory Visit: Payer: Self-pay | Admitting: General Surgery

## 2013-05-31 DIAGNOSIS — R0602 Shortness of breath: Secondary | ICD-10-CM

## 2013-06-01 ENCOUNTER — Other Ambulatory Visit (INDEPENDENT_AMBULATORY_CARE_PROVIDER_SITE_OTHER): Payer: Medicare Other

## 2013-06-01 DIAGNOSIS — R0602 Shortness of breath: Secondary | ICD-10-CM

## 2013-06-01 LAB — BRAIN NATRIURETIC PEPTIDE: Pro B Natriuretic peptide (BNP): 65 pg/mL (ref 0.0–100.0)

## 2013-06-05 ENCOUNTER — Other Ambulatory Visit: Payer: Self-pay | Admitting: General Surgery

## 2013-06-05 DIAGNOSIS — E662 Morbid (severe) obesity with alveolar hypoventilation: Secondary | ICD-10-CM

## 2013-06-05 NOTE — Progress Notes (Signed)
Pt is aware and is set up for a f/u visit in 4 weeks. I also ordered the referral to [facility]

## 2013-06-07 ENCOUNTER — Institutional Professional Consult (permissible substitution): Payer: Medicare Other | Admitting: Internal Medicine

## 2013-06-28 ENCOUNTER — Ambulatory Visit (INDEPENDENT_AMBULATORY_CARE_PROVIDER_SITE_OTHER): Payer: Medicare Other | Admitting: Internal Medicine

## 2013-06-28 ENCOUNTER — Encounter: Payer: Self-pay | Admitting: Internal Medicine

## 2013-06-28 ENCOUNTER — Ambulatory Visit (INDEPENDENT_AMBULATORY_CARE_PROVIDER_SITE_OTHER)
Admission: RE | Admit: 2013-06-28 | Discharge: 2013-06-28 | Disposition: A | Discharge status: Home / Self Care | Payer: Medicare Other | Source: Ambulatory Visit | Attending: Internal Medicine | Admitting: Internal Medicine

## 2013-06-28 ENCOUNTER — Institutional Professional Consult (permissible substitution): Payer: Medicare Other | Admitting: Internal Medicine

## 2013-06-28 VITALS — BP 130/86 | HR 54 | Temp 98.3°F | Ht 72.0 in | Wt 274.0 lb

## 2013-06-28 DIAGNOSIS — R0609 Other forms of dyspnea: Secondary | ICD-10-CM

## 2013-06-28 DIAGNOSIS — R0989 Other specified symptoms and signs involving the circulatory and respiratory systems: Secondary | ICD-10-CM

## 2013-06-28 DIAGNOSIS — G4733 Obstructive sleep apnea (adult) (pediatric): Secondary | ICD-10-CM

## 2013-06-28 MED ORDER — FAMOTIDINE 20 MG PO TABS: ORAL_TABLET | ORAL | Status: DC

## 2013-06-28 MED ORDER — PANTOPRAZOLE SODIUM 40 MG PO TBEC: 40.0000 mg | DELAYED_RELEASE_TABLET | Freq: Every day | ORAL | Status: DC

## 2013-06-28 NOTE — Assessment & Plan Note (Addendum)
Longstanding and now complicated by hypercarbia suggested by [id number] 34 02/2013 > ? Needs conversion to bipap> referred to our group for sleep re-eval as last done ? 10 y ago

## 2013-06-28 NOTE — Progress Notes (Signed)
   Subjective:    Patient ID: [name], male    DOB: February 17, 1946 MRN: [id number]  HPI  [age] yowm with MO quit smoking 1975 at wt 210 and gradually gained up to around 275 then noted indolent onset of doe x mid 2014 and referred 06/28/2013  by Dr Mayford Knifeurner p neg cardiac eval with bnp 65 on 06/01/14    06/28/2013 1st [facility] office visit/ Coty Larsh cc sob >> cough x 6 months sleeps on cpap wakes up feeling rested  but short of breath just walking across house - uses 02 prn from concentrator since 2004 can turn a bad day into a better one, has never tried saba.  Mucus is white, less than a tsp, mostly in ams.  No obvious day to day or daytime variabilty or assoc chronic cough or cp or chest tightness, subjective wheeze overt sinus or hb symptoms. No unusual exp hx or h/o childhood pna/ asthma or knowledge of premature birth.  Sleeping ok without nocturnal  or early am exacerbation  of respiratory  c/o's or need for noct saba. Also denies any obvious fluctuation of symptoms with weather or environmental changes or other aggravating or alleviating factors except as outlined above   Current Medications, Allergies, Complete Past Medical History, Past Surgical History, Family History, and Social History were reviewed in [facility] electronic medical record.    Review of Systems  Constitutional: Positive for unexpected weight change. Negative for fever.  HENT: Positive for congestion and dental problem. Negative for ear pain, nosebleeds, postnasal drip, rhinorrhea, sinus pressure, sneezing, sore throat and trouble swallowing.   Eyes: Negative for redness and itching.  Respiratory: Positive for cough and shortness of breath. Negative for chest tightness and wheezing.   Cardiovascular: Negative for palpitations and leg swelling.  Gastrointestinal: Negative for nausea and vomiting.  Genitourinary: Negative for dysuria.  Musculoskeletal: Negative for joint swelling.  Skin: Negative for rash.   Neurological: Negative for headaches.  Hematological: Does not bruise/bleed easily.  Psychiatric/Behavioral: Positive for dysphoric mood. The patient is nervous/anxious.        Objective:   Physical Exam  amb slt hoarse morbidly obese wm nad  Wt Readings from Last 3 Encounters:  06/28/13 274 lb (124.286 kg)  05/30/13 285 lb (129.275 kg)  05/05/13 285 lb (129.275 kg)     HEENT: nl dentition, turbinates, and orophanx. Nl external ear canals without cough reflex   NECK :  without JVD/Nodes/TM/ nl carotid upstrokes bilaterally   LUNGS: no acc muscle use, clear to A and P bilaterally without cough on insp or exp maneuvers   CV:  RRR  no s3 or murmur or increase in P2, no edema   ABD: massively obese but  soft and nontender with limitied  excursion in the supine position. No bruits or organomegaly, bowel sounds nl  MS:  warm without deformities, calf tenderness, cyanosis or clubbing  SKIN: warm and dry with venous insuf changes both le's   NEURO:  alert, approp, no deficits    CXR  06/28/2013 :  Somewhat low lung volumes with bibasilar atelectasis and/or scarring.   Spirometry 06/28/2013 restrictive       Assessment & Plan:

## 2013-06-28 NOTE — Patient Instructions (Addendum)
Pantoprazole (protonix) 40 mg   Take 30-60 min before first meal of the day and Pepcid 20 mg one bedtime until return to office - this is the best way to tell whether stomach acid is contributing to your problem.    GERD (REFLUX)  is an extremely common cause of respiratory symptoms, many times with no significant heartburn at all.    It can be treated with medication, but also with lifestyle changes including avoidance of late meals, excessive alcohol, smoking cessation, and avoid fatty foods, chocolate, peppermint, colas, red wine, and acidic juices such as orange juice.  NO MINT OR MENTHOL PRODUCTS SO NO COUGH DROPS  USE SUGARLESS CANDY INSTEAD (jolley ranchers or Stover's)  NO OIL BASED VITAMINS - use powdered substitutes.    Please remember to go to the  x-ray department downstairs for your tests - we will call you with the results when they are available.  Schedule evaluation by one of our sleep doctors either Dr Vassie LollAlva or Craige CottaSood

## 2013-06-28 NOTE — Assessment & Plan Note (Addendum)
-    02/2013 HC03 = 34 - 06/28/2013   Walked RA x three laps @ 185 stopped due to end of study, legs gave out, no desat - spirometry 06/28/2013 >> restrictive changes only   His problem is c/w obesity complicated by hypercarbic resp failure with no evidence of any copd/ab  rec wt loss, sleep eval

## 2013-07-04 ENCOUNTER — Ambulatory Visit: Payer: Medicare Other | Admitting: Cardiology

## 2013-07-05 ENCOUNTER — Encounter: Payer: Self-pay | Admitting: Cardiology

## 2013-07-05 ENCOUNTER — Ambulatory Visit (INDEPENDENT_AMBULATORY_CARE_PROVIDER_SITE_OTHER): Payer: Medicare Other | Admitting: Cardiology

## 2013-07-05 VITALS — BP 111/60 | HR 56 | Ht 72.0 in | Wt 287.8 lb

## 2013-07-05 DIAGNOSIS — I10 Essential (primary) hypertension: Secondary | ICD-10-CM

## 2013-07-05 DIAGNOSIS — R0989 Other specified symptoms and signs involving the circulatory and respiratory systems: Secondary | ICD-10-CM

## 2013-07-05 DIAGNOSIS — R0609 Other forms of dyspnea: Secondary | ICD-10-CM

## 2013-07-05 DIAGNOSIS — I2510 Atherosclerotic heart disease of native coronary artery without angina pectoris: Secondary | ICD-10-CM

## 2013-07-05 NOTE — Patient Instructions (Signed)
Your physician recommends that you continue on your current medications as directed. Please refer to the Current Medication list given to you today. Your physician wants you to follow-up in: 12 months with Dr. Turner.  You will receive a reminder letter in the mail two months in advance. If you don't receive a letter, please call our office to schedule the follow-up appointment.  

## 2013-07-05 NOTE — Progress Notes (Signed)
[address] [place], [place]  [address] Phone: [phone] Fax:  [phone]  Date:  07/05/2013   ID:  [name], DOB 1945-11-26, MRN [id number]  PCP:  [name], MD  Cardiologist:  Armanda Magic, MD     History of Present Illness: [name] is a 68 y.o. male with a history of DM, HTN, obesity and OSA who presents today for SOB and DOE. When I saw him a few weeks ago he had noticed a lot of SOB with exertion especially taking the trash out. He denied any exertional chest pain or pressure. He said that he has had SOB for years but thinks that it may be worse. He underwent nuclear stress test which was low risk showing a fixed defect in the inferior wall c/w diaphragmatic attenuation but no ischemia and normal LVF. 2D echo showed normal LVF with diastolic dysfunction and BNP was normal.  He now presents back today for followup.  He says that his SOB is about the same.  He recently saw Dr. Sherene Sires and was told he does not have COPD.  He felt that his obesity was the main factor in his SOB.  He only has the SOB when he gets out of bed and then after eating breakfast he is fine.  He does have some DOE when taking out trash which has not changed any in years.  Wt Readings from Last 3 Encounters:  07/05/13 287 lb 12.8 oz (130.545 kg)  06/28/13 274 lb (124.286 kg)  05/30/13 285 lb (129.275 kg)     Past Medical History  Diagnosis Date  . Neck abscess   . Diabetes mellitus   . Hypertension   . MRSA (methicillin resistant Staphylococcus aureus)   . 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 amLODipine (NORVASC) 5 MG tablet       . BAYER CONTOUR TEST test strip       . dorzolamide (TRUSOPT) 2 % ophthalmic solution Place 1 drop into both eyes 2 (two) times daily.       . famotidine (PEPCID) 20 MG tablet One at bedtime  30 tablet  2  . furosemide (LASIX) 40 MG tablet  Take 40 mg by mouth daily.       Marland Kitchen levothyroxine (SYNTHROID, LEVOTHROID) 100 MCG tablet Take 100 mcg by mouth daily.      . Multiple Vitamin (MULITIVITAMIN WITH MINERALS) TABS Take 1 tablet by mouth daily.      . nitroGLYCERIN (NITRODUR - DOSED IN MG/24 HR) 0.2 mg/hr patch Place 0.2 mg onto the skin daily.       Marland Kitchen NOVOLIN 70/30 (70-30) 100 UNIT/ML injection Inject into the skin 3 (three) times daily. According to sliding scale.      . olmesartan (BENICAR) 40 MG tablet Take 40 mg by mouth daily. 1/2 tab      . pantoprazole (PROTONIX) 40 MG tablet Take 1 tablet (40 mg total) by mouth daily. Take 30-60 min before first meal of the day  30 tablet  2   No current facility-administered medications for this visit.    Allergies:    Allergies  Allergen Reactions  . Penicillins     REACTION: swollen tongue and eyes    Social History:  The patient  reports that he quit smoking about 40 years ago. His smoking use included Cigarettes. He has a 5  pack-year smoking history. He has never used smokeless tobacco. He reports that he does not drink alcohol or use illicit drugs.   Family History:  The patient's family history includes Heart disease in his father and mother.   ROS:  Please see the history of present illness.      All other systems reviewed and negative.   PHYSICAL EXAM: VS:  BP 111/60  Pulse 56  Ht 6' (1.829 m)  Wt 287 lb 12.8 oz (130.545 kg)  BMI 39.02 kg/m2 Well nourished, well developed, in no acute distress HEENT: normal Neck: no JVD Cardiac:  normal S1, S2; RRR; no murmur Lungs:  clear to auscultation bilaterally, no wheezing, rhonchi or rales Abd: soft, nontender, no hepatomegaly Ext: no edema Skin: warm and dry Neuro:  CNs 2-12 intact, no focal abnormalities noted  ASSESSMENT AND PLAN:  1.   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   Chronic DOE - most likely secondary to morbid obesity 3.   Morbid obesity  4.   HTN - well  controlled   - continue Benicar/amlodipine   Followup with me in 1 year       Signed, Armanda Magicraci Santosha Jividen, MD 07/05/2013 1:13 PM

## 2013-08-01 ENCOUNTER — Institutional Professional Consult (permissible substitution): Payer: Medicare Other | Admitting: Pulmonary Disease

## 2013-08-30 ENCOUNTER — Ambulatory Visit (INDEPENDENT_AMBULATORY_CARE_PROVIDER_SITE_OTHER): Payer: Medicare Other | Admitting: Pulmonary Disease

## 2013-08-30 ENCOUNTER — Encounter: Payer: Self-pay | Admitting: Pulmonary Disease

## 2013-08-30 VITALS — BP 112/74 | HR 60 | Ht 72.0 in | Wt 283.0 lb

## 2013-08-30 DIAGNOSIS — G4733 Obstructive sleep apnea (adult) (pediatric): Secondary | ICD-10-CM

## 2013-08-30 NOTE — Patient Instructions (Signed)
Will arrange for home sleep study Will call to arrange for follow up after sleep study reviewed  

## 2013-08-30 NOTE — Progress Notes (Signed)
Chief Complaint  Patient presents with  . Sleep Consult    Epworth Score: 7.    History of Present Illness: [name] is a 68 y.o. male for evaluation of sleep problems.  He was started on CPAP in 2004.  He does not recall having a sleep study.  He needs new supplies for his CPAP.  He sleeps okay with CPAP, but snores w/o CPAP.  He will nap sometimes during the day >> he does not use his CPAP then, but sleeps in a recliner.  He goes to sleep at 11 pm.  He falls asleep after a while.  He wakes up 2 to 3 times to use the bathroom.  He gets out of bed at 8 am.  He feels okay in the morning.  He denies morning headache.  He does not use anything to help him fall sleep or stay awake.  He will sometimes drink coffee before going to bed.  He has lost 6 lbs since January.  He denies sleep walking, sleep talking, bruxism, or nightmares.  There is no history of restless legs.  He denies sleep hallucinations, sleep paralysis, or cataplexy.  The Epworth score is 7 out of 24.  Tests: Echo 05/24/13 >> mod LVH, EF 55 to 60%, grade 2 diastolic dysfx  Corrigan A Wilbourne  has a past medical history of Neck abscess; Diabetes mellitus; Hypertension; MRSA (methicillin resistant Staphylococcus aureus); and Coronary artery disease (cath 2006).  DARVIS CROFT  has past surgical history that includes Back surgery and Abscess drainage.  Prior to Admission medications   Medication Sig Start Date End Date Taking? Authorizing Provider  amLODipine (NORVASC) 5 MG tablet Take 5 mg by mouth daily.  04/09/13  Yes Historical Provider, MD  aspirin EC 81 MG tablet Take 81 mg by mouth daily.   Yes Historical Provider, MD  BAYER CONTOUR TEST test strip  04/06/11  Yes Historical Provider, MD  dorzolamide (TRUSOPT) 2 % ophthalmic solution Place 1 drop into both eyes 2 (two) times daily.  03/08/11  Yes Historical Provider, MD  famotidine (PEPCID) 20 MG tablet One at bedtime 06/28/13  Yes Nyoka Cowden, MD  levothyroxine  (SYNTHROID, LEVOTHROID) 100 MCG tablet Take 100 mcg by mouth daily.   Yes Historical Provider, MD  Multiple Vitamin (MULITIVITAMIN WITH MINERALS) TABS Take 1 tablet by mouth daily.   Yes Historical Provider, MD  nitroGLYCERIN (NITRODUR - DOSED IN MG/24 HR) 0.2 mg/hr patch Place 0.2 mg onto the skin daily.  04/27/13  Yes Historical Provider, MD  NOVOLIN 70/30 (70-30) 100 UNIT/ML injection Inject into the skin 3 (three) times daily. According to sliding scale. 04/07/11  Yes Historical Provider, MD  olmesartan (BENICAR) 40 MG tablet Take 20 mg by mouth daily.    Yes Historical Provider, MD  pantoprazole (PROTONIX) 40 MG tablet Take 1 tablet (40 mg total) by mouth daily. Take 30-60 min before first meal of the day 06/28/13  Yes Nyoka Cowden, MD    Allergies  Allergen Reactions  . Penicillins     REACTION: swollen tongue and eyes    His family history includes Heart disease in his father and mother.  He  reports that he quit smoking about 40 years ago. His smoking use included Cigarettes. He has a 5 pack-year smoking history. He has never used smokeless tobacco. He reports that he does not drink alcohol or use illicit drugs.  Review of Systems  Constitutional: Negative for fever, chills, diaphoresis, activity change, appetite  change, fatigue and unexpected weight change.  HENT: Negative for congestion, dental problem, ear discharge, ear pain, facial swelling, hearing loss, mouth sores, nosebleeds, postnasal drip, rhinorrhea, sinus pressure, sneezing, sore throat, tinnitus, trouble swallowing and voice change.   Eyes: Negative for photophobia, discharge, itching and visual disturbance.  Respiratory: Positive for shortness of breath. Negative for apnea, cough, choking, chest tightness, wheezing and stridor.   Cardiovascular: Negative for chest pain, palpitations and leg swelling.  Gastrointestinal: Negative for nausea, vomiting, abdominal pain, constipation, blood in stool and abdominal distention.   Genitourinary: Negative for dysuria, urgency, frequency, hematuria, flank pain, decreased urine volume and difficulty urinating.  Musculoskeletal: Negative for arthralgias, back pain, gait problem, joint swelling, myalgias, neck pain and neck stiffness.  Skin: Negative for color change, pallor and rash.  Neurological: Negative for dizziness, tremors, seizures, syncope, speech difficulty, weakness, light-headedness, numbness and headaches.  Hematological: Negative for adenopathy. Does not bruise/bleed easily.  Psychiatric/Behavioral: Negative for confusion, sleep disturbance and agitation. The patient is not nervous/anxious.    Physical Exam:  General - No distress ENT - No sinus tenderness, no oral exudate, no LAN, no thyromegaly, TM clear, pupils equal/reactive, MP 3, enlarged tongue, poor dentition Cardiac - s1s2 regular, no murmur, pulses symmetric Chest - No wheeze/rales/dullness, good air entry, normal respiratory excursion Back - No focal tenderness Abd - Soft, non-tender, no organomegaly, + bowel sounds Ext - No edema Neuro - Normal strength, cranial nerves intact Skin - No rashes Psych - Normal mood, and behavior  Assessment/plan:  Coralyn HellingVineet Viren Lebeau, M.D. Pager [phone]

## 2013-08-30 NOTE — Progress Notes (Deleted)
   Subjective:    Patient ID: [name], male    DOB: 06/11/1946, 68 y.o.   MRN: [id number]  HPI    Review of Systems  Constitutional: Negative for fever, chills, diaphoresis, activity change, appetite change, fatigue and unexpected weight change.  HENT: Negative for congestion, dental problem, ear discharge, ear pain, facial swelling, hearing loss, mouth sores, nosebleeds, postnasal drip, rhinorrhea, sinus pressure, sneezing, sore throat, tinnitus, trouble swallowing and voice change.   Eyes: Negative for photophobia, discharge, itching and visual disturbance.  Respiratory: Positive for shortness of breath. Negative for apnea, cough, choking, chest tightness, wheezing and stridor.   Cardiovascular: Negative for chest pain, palpitations and leg swelling.  Gastrointestinal: Negative for nausea, vomiting, abdominal pain, constipation, blood in stool and abdominal distention.  Genitourinary: Negative for dysuria, urgency, frequency, hematuria, flank pain, decreased urine volume and difficulty urinating.  Musculoskeletal: Negative for arthralgias, back pain, gait problem, joint swelling, myalgias, neck pain and neck stiffness.  Skin: Negative for color change, pallor and rash.  Neurological: Negative for dizziness, tremors, seizures, syncope, speech difficulty, weakness, light-headedness, numbness and headaches.  Hematological: Negative for adenopathy. Does not bruise/bleed easily.  Psychiatric/Behavioral: Negative for confusion, sleep disturbance and agitation. The patient is not nervous/anxious.        Objective:   Physical Exam        Assessment & Plan:

## 2013-09-03 NOTE — Assessment & Plan Note (Addendum)
He has reported history of sleep apnea, but per patient has never had a sleep study.  He has been using CPAP for over 10 years, and reports benefit.  He reports snoring, sleep disruption, witnessed apnea and daytime sleepiness if he does not use CPAP.  He has history of hypertension, diabetes mellitus and coronary artery disease.  We discussed how sleep apnea can affect various health problems including risks for hypertension, cardiovascular disease, and diabetes.  We also discussed how sleep disruption can increase risks for accident, such as while driving.  Weight loss as a means of improving sleep apnea was also reviewed.  Additional treatment options discussed were CPAP therapy, oral appliance, and surgical intervention.  To further assess will arrange for home sleep study.  Depending on results he may need in lab sleep study also at some point.

## 2013-10-12 DIAGNOSIS — G4733 Obstructive sleep apnea (adult) (pediatric): Secondary | ICD-10-CM

## 2013-10-15 ENCOUNTER — Telehealth: Payer: Self-pay | Admitting: Pulmonary Disease

## 2013-10-15 DIAGNOSIS — G4733 Obstructive sleep apnea (adult) (pediatric): Secondary | ICD-10-CM

## 2013-10-15 NOTE — Telephone Encounter (Signed)
HST 10/12/13 >> AHI 28.7, SaO2 low 66%.  Will have my nurse inform patient that he has moderate sleep apnea.  Please send order to arrange for new CPAP supplies.  Please arrange for ROV in 6 to 8 weeks.

## 2013-10-16 ENCOUNTER — Other Ambulatory Visit: Payer: Self-pay | Admitting: Internal Medicine

## 2013-10-16 DIAGNOSIS — G4733 Obstructive sleep apnea (adult) (pediatric): Secondary | ICD-10-CM

## 2013-10-16 NOTE — Telephone Encounter (Signed)
Pt is aware of results. Order will be placed for CPAP supplies. Recall will be placed for 6-8 week ROV.

## 2013-10-17 ENCOUNTER — Encounter: Payer: Self-pay | Admitting: Pulmonary Disease

## 2013-11-08 ENCOUNTER — Other Ambulatory Visit: Payer: Self-pay | Admitting: Internal Medicine

## 2013-12-11 ENCOUNTER — Encounter (HOSPITAL_BASED_OUTPATIENT_CLINIC_OR_DEPARTMENT_OTHER): Payer: Medicare Other | Attending: Plastic Surgery

## 2013-12-11 DIAGNOSIS — G4733 Obstructive sleep apnea (adult) (pediatric): Secondary | ICD-10-CM | POA: Insufficient documentation

## 2013-12-11 DIAGNOSIS — Z7982 Long term (current) use of aspirin: Secondary | ICD-10-CM | POA: Insufficient documentation

## 2013-12-11 DIAGNOSIS — E669 Obesity, unspecified: Secondary | ICD-10-CM | POA: Insufficient documentation

## 2013-12-11 DIAGNOSIS — I2510 Atherosclerotic heart disease of native coronary artery without angina pectoris: Secondary | ICD-10-CM | POA: Insufficient documentation

## 2013-12-11 DIAGNOSIS — E1169 Type 2 diabetes mellitus with other specified complication: Secondary | ICD-10-CM | POA: Insufficient documentation

## 2013-12-11 DIAGNOSIS — L97509 Non-pressure chronic ulcer of other part of unspecified foot with unspecified severity: Secondary | ICD-10-CM | POA: Insufficient documentation

## 2013-12-11 DIAGNOSIS — Z79899 Other long term (current) drug therapy: Secondary | ICD-10-CM | POA: Insufficient documentation

## 2013-12-11 DIAGNOSIS — Z87891 Personal history of nicotine dependence: Secondary | ICD-10-CM | POA: Insufficient documentation

## 2013-12-11 DIAGNOSIS — I8390 Asymptomatic varicose veins of unspecified lower extremity: Secondary | ICD-10-CM | POA: Insufficient documentation

## 2013-12-11 DIAGNOSIS — I10 Essential (primary) hypertension: Secondary | ICD-10-CM | POA: Insufficient documentation

## 2013-12-11 NOTE — Progress Notes (Signed)
[facility]  NAME:  [name], Slayden               ACCOUNT NO.:  [id number]  MEDICAL RECORD NO.:  [id number]      DATE OF BIRTH:  [birth year]  PHYSICIAN:  [name], DO       VISIT DATE:  12/11/2013                                  OFFICE VISIT   HISTORY OF PRESENT ILLNESS:  The patient is a 68 year old male who is here for evaluation of his right diabetic foot ulcer.  He is a known patient to the practice from previous years and his wife noticed a blister on his right foot that they had on Friday and it is opened up, so he has been just keeping it dry and covered.  PAST MEDICAL HISTORY:  Positive for: 1. Diabetes. 2. Hypertension. 3. Obesity. 4. Obstructive sleep apnea. 5. Neck abscess. 6. Coronary artery disease.  ALLERGIES:  PENICILLIN.  MEDICATIONS:  Norvasc, aspirin, Pepcid, Lasix, Synthroid, multivitamin, Glucerna, insulin, Benicar, Protonix.  SOCIAL HISTORY:  He lives with his wife, has some family support.  He was a heavy smoker but quit about 40 years ago.  He drinks occasionally but nothing in excess.  FAMILY HISTORY:  Not contributory but heart disease is present.  REVIEW OF SYSTEMS:  Negative.  PHYSICAL EXAMINATION:  VITAL SIGNS:  Noted. GENERAL:  He is alert, oriented, cooperative, not in any distress. LUNGS:  His breathing is unlabored. HEART:  Rate is regular.  Upper extremity pulses are equal bilaterally. ABDOMEN:  Soft, nontender. EXTREMITIES:  He has some varicose veins of the lower extremities, dry feet, pulses present.  He has got a superficial ulcer on his right foot Wagner 1.  The left foot looks like he has had a possible puncture but it is not very clear, it is not red, no signs of infection.  Some debridement was done.  We will plan for collagen on the right foot, daily soaks, Bag Balm for the dry areas, multivitamin, vitamin C, zinc, Glucerna supplements.  We will check a pre-albumin and send him for vascular studies.   We will see him back in 1 week.     [name], DO     CS/MEDQ  D:  12/11/2013  T:  12/11/2013  Job:  [id number]

## 2013-12-18 ENCOUNTER — Encounter (HOSPITAL_BASED_OUTPATIENT_CLINIC_OR_DEPARTMENT_OTHER): Payer: Medicare Other | Attending: Plastic Surgery

## 2013-12-18 DIAGNOSIS — L97809 Non-pressure chronic ulcer of other part of unspecified lower leg with unspecified severity: Secondary | ICD-10-CM | POA: Insufficient documentation

## 2013-12-20 ENCOUNTER — Other Ambulatory Visit: Payer: Self-pay | Admitting: Internal Medicine

## 2013-12-25 DIAGNOSIS — L97809 Non-pressure chronic ulcer of other part of unspecified lower leg with unspecified severity: Secondary | ICD-10-CM | POA: Diagnosis not present

## 2014-01-01 DIAGNOSIS — L97809 Non-pressure chronic ulcer of other part of unspecified lower leg with unspecified severity: Secondary | ICD-10-CM | POA: Diagnosis not present

## 2014-01-05 DIAGNOSIS — L97809 Non-pressure chronic ulcer of other part of unspecified lower leg with unspecified severity: Secondary | ICD-10-CM | POA: Diagnosis not present

## 2014-01-08 DIAGNOSIS — L97809 Non-pressure chronic ulcer of other part of unspecified lower leg with unspecified severity: Secondary | ICD-10-CM | POA: Diagnosis not present

## 2014-01-09 NOTE — Progress Notes (Signed)
[facility]  NAME:  [name], Troy               ACCOUNT NO.:  [id number]  MEDICAL RECORD NO.:  [id number]      DATE OF BIRTH:  07/28/1945  PHYSICIAN:  [name], DO       VISIT DATE:  01/08/2014                                  OFFICE VISIT   HISTORY OF PRESENT ILLNESS:  The patient is a 68 year old male who is here for followup on his bilateral lower extremity ulcers.  The left is doing extremely well and seems to have healed.  The right has shown improvement.  The plantar area that was questionable seems to have healed from the undersurface and epithelialized.  The portion at the distal metatarsal area has improved.  It is filling in.  It does not appear to be infected.  He was in a noncontact cast and had some serous trouble with walking so that was removed and he has been getting collagen since.  There is no change in his medications or social history.  REVIEW OF SYSTEMS:  Negative.  PHYSICAL EXAMINATION:  On exam, he is alert, oriented, cooperative, not in any distress.  He is very pleasant.  Pupils are equal.  Extraocular muscles are intact.  No cervical lymphadenopathy.  His breathing is unlabored.  His heart rate is regular.  He has swelling, some hemosiderosis, and varicose veins of the lower extremity.  The wound is improving, it does not appear to be infected.  We will continue with the collagen, elevation, multivitamin, vitamin C, and see him back in 1 week.     [name], DO     CS/MEDQ  D:  01/08/2014  T:  01/09/2014  Job:  [id number]

## 2014-01-14 ENCOUNTER — Other Ambulatory Visit: Payer: Self-pay | Admitting: Internal Medicine

## 2014-01-15 ENCOUNTER — Other Ambulatory Visit (HOSPITAL_COMMUNITY): Payer: Self-pay | Admitting: Plastic Surgery

## 2014-01-15 ENCOUNTER — Ambulatory Visit (HOSPITAL_COMMUNITY)
Admission: RE | Admit: 2014-01-15 | Discharge: 2014-01-15 | Disposition: A | Discharge status: Home / Self Care | Payer: Medicare Other | Source: Ambulatory Visit | Attending: Plastic Surgery | Admitting: Plastic Surgery

## 2014-01-15 ENCOUNTER — Encounter (HOSPITAL_BASED_OUTPATIENT_CLINIC_OR_DEPARTMENT_OTHER): Payer: Medicare Other | Attending: Plastic Surgery

## 2014-01-15 DIAGNOSIS — M869 Osteomyelitis, unspecified: Secondary | ICD-10-CM

## 2014-01-15 DIAGNOSIS — E1169 Type 2 diabetes mellitus with other specified complication: Secondary | ICD-10-CM | POA: Insufficient documentation

## 2014-01-15 DIAGNOSIS — L97509 Non-pressure chronic ulcer of other part of unspecified foot with unspecified severity: Secondary | ICD-10-CM | POA: Insufficient documentation

## 2014-01-15 DIAGNOSIS — M7730 Calcaneal spur, unspecified foot: Secondary | ICD-10-CM | POA: Diagnosis not present

## 2014-01-15 DIAGNOSIS — I7090 Unspecified atherosclerosis: Secondary | ICD-10-CM | POA: Diagnosis not present

## 2014-01-15 DIAGNOSIS — Z794 Long term (current) use of insulin: Secondary | ICD-10-CM | POA: Diagnosis not present

## 2014-01-15 DIAGNOSIS — L97409 Non-pressure chronic ulcer of unspecified heel and midfoot with unspecified severity: Secondary | ICD-10-CM | POA: Diagnosis not present

## 2014-01-15 NOTE — Progress Notes (Signed)
[facility]  NAME:  [name], [name]               ACCOUNT NO.:  [id number]  MEDICAL RECORD NO.:  [id number]      DATE OF BIRTH:  1946/01/23  PHYSICIAN:  [name], DO       VISIT DATE:  01/15/2014                                  OFFICE VISIT   The patient is a 68 year old, very pleasant male who is here for a followup on his right foot diabetic ulcer.  He has been undergoing local treatment and hyperbaric oxygen treatment.  Today, he presents with some redness on the lateral aspect of the metatarsal area. Debridement was done and there was a little bit of tracking that was noted down to the head of the fifth metatarsal distally and making this a Wagner 3.  A bone biopsy was obtained and sent for cultures.  We will also get an x- ray to see if there is anything else that is worrisome.  I am going to go ahead and start him on Bactrim now.  If this gets any worse, I want him to call us immediately.  We will continue with the HBO treatment. We may switch to wet-to-dry twice a day, and Dial soap soaks, and I want to see him back in 1 week.     [name], DO     CS/MEDQ  D:  01/15/2014  T:  01/15/2014  Job:  [id number]

## 2014-01-22 DIAGNOSIS — Z794 Long term (current) use of insulin: Secondary | ICD-10-CM | POA: Diagnosis not present

## 2014-01-22 DIAGNOSIS — E1169 Type 2 diabetes mellitus with other specified complication: Secondary | ICD-10-CM | POA: Diagnosis not present

## 2014-01-22 DIAGNOSIS — L97409 Non-pressure chronic ulcer of unspecified heel and midfoot with unspecified severity: Secondary | ICD-10-CM | POA: Diagnosis not present

## 2014-01-22 LAB — GLUCOSE, CAPILLARY
Glucose-Capillary: 59 mg/dL — ABNORMAL LOW (ref 70–99)
Glucose-Capillary: 67 mg/dL — ABNORMAL LOW (ref 70–99)

## 2014-01-22 NOTE — Progress Notes (Signed)
[facility]  NAME:  Izora RibasFARRAR, Troy               ACCOUNT NO.:  [id number]  MEDICAL RECORD NO.:  [id number]      DATE OF BIRTH:  03-13-1946  PHYSICIAN:  [name], DO       VISIT DATE:  01/22/2014                                  OFFICE VISIT   The patient is a 68-year-old very pleasant gentleman who is here for followup on his right foot diabetic Wagner 3 ulcer.  He had a debridement last week with bone cultures, and they are positive, sensitive to Bactrim.  Bactrim was started a few days ago, so he has another week and a half left.  There is a little bit of redness that seems to be just a slight bit worse than last week.  He is also receiving hyperbaric treatment.  There is no change in his social history.  The medications has the Bactrim added.  He is concerned about it and very willing to seek further care.  On exam, he is alert, oriented, cooperative, not in any acute distress. He is pleasant like usual.  Pupils are equal.  Breathing is unlabored. Heart rate is regular.  He was a little bit shaky.  His blood sugar was low and so that was treated.  We will continue with the silver collagen on the foot, the Bactrim, and we will seek a consult with Dr. Victorino DikeHewitt.  The x-ray was not very helpful and the MRI I do not think will be too much more helpful since we already have the bone culture, but we will leave that to Dr. Victorino DikeHewitt, and we will see him back in a week.     [name], DO     CS/MEDQ  D:  01/22/2014  T:  01/22/2014  Job:  [id number]

## 2014-01-29 DIAGNOSIS — Z794 Long term (current) use of insulin: Secondary | ICD-10-CM | POA: Diagnosis not present

## 2014-01-29 DIAGNOSIS — E1169 Type 2 diabetes mellitus with other specified complication: Secondary | ICD-10-CM | POA: Diagnosis not present

## 2014-01-29 DIAGNOSIS — L97409 Non-pressure chronic ulcer of unspecified heel and midfoot with unspecified severity: Secondary | ICD-10-CM | POA: Diagnosis not present

## 2014-01-29 NOTE — Progress Notes (Signed)
[facility]  NAME:  [name], [name]                    ACCOUNT NO.:  MEDICAL RECORD NO.:  [id number]      DATE OF BIRTH:  [birth year]  PHYSICIAN:  [name], DO       VISIT DATE:  01/29/2014                                  OFFICE VISIT   The patient is a 68 year old very pleasant male who is here for followup on his right lower extremity fifth metatarsal plantar ulceration.  He has undergone debridement and we did a bone biopsy, was very concerning for infection and so, we started him on antibiotic and sent him to Dr. Victorino DikeHewitt for a consult.  He is alert, oriented, cooperative, not in any distress.  There is no change in his medications.  He did get a refill from Dr. Victorino DikeHewitt and an MRI has been ordered for seeing the involvement of the bone destruction.  He is to stay in the [place], elevation, multivitamin, vitamin C, zinc, protein, and the foot shows some signs of improvement but it is still concerning for infection, but we will wait to hear back from Dr. Victorino DikeHewitt and his plan.     [name], DO     CS/MEDQ  D:  01/29/2014  T:  01/29/2014  Job:  [id number]

## 2014-02-12 ENCOUNTER — Ambulatory Visit (HOSPITAL_COMMUNITY)
Admission: RE | Admit: 2014-02-12 | Discharge: 2014-02-12 | Disposition: A | Discharge status: Home / Self Care | Payer: Medicare Other | Source: Ambulatory Visit | Attending: Plastic Surgery | Admitting: Plastic Surgery

## 2014-02-12 ENCOUNTER — Other Ambulatory Visit (HOSPITAL_COMMUNITY): Payer: Self-pay | Admitting: Plastic Surgery

## 2014-02-12 DIAGNOSIS — E1169 Type 2 diabetes mellitus with other specified complication: Secondary | ICD-10-CM | POA: Diagnosis not present

## 2014-02-12 DIAGNOSIS — X58XXXA Exposure to other specified factors, initial encounter: Secondary | ICD-10-CM | POA: Diagnosis not present

## 2014-02-12 DIAGNOSIS — T148XXA Other injury of unspecified body region, initial encounter: Secondary | ICD-10-CM | POA: Diagnosis present

## 2014-02-12 DIAGNOSIS — Z794 Long term (current) use of insulin: Secondary | ICD-10-CM | POA: Diagnosis not present

## 2014-02-12 DIAGNOSIS — L97409 Non-pressure chronic ulcer of unspecified heel and midfoot with unspecified severity: Secondary | ICD-10-CM | POA: Diagnosis not present

## 2014-02-13 NOTE — Progress Notes (Signed)
[facility]  NAME:  [name], [name]               ACCOUNT NO.:  [id number]  MEDICAL RECORD NO.:  [id number]      DATE OF BIRTH:  November 26, 1945  PHYSICIAN:  Wayland Denis, DO       VISIT DATE:  02/12/2014                                  OFFICE VISIT   The patient is a very nice 68 year old gentleman who is here for followup on his right foot diabetic Wagner 3 ulcer.  He is alert, oriented and cooperative, not in any distress.  He has been taking Bactrim without significant change.  He had an MRI.  He is being seen by Dr. Victorino Dike and has a follow up in the next week.  The cultures were not impressive so far.  There is almost a cm and a half of tunneling down to bone with redness on the lateral aspect of the foot at the fifth metatarsal area, so I am concerned that this has infection and hopefully the MRI will reveal that for [place], so we will continue with the silver collagen soaking, and we will see him back in 2 weeks.     Wayland Denis, DO     CS/MEDQ  D:  02/12/2014  T:  02/13/2014  Job:  [id number]

## 2014-02-14 ENCOUNTER — Encounter (HOSPITAL_BASED_OUTPATIENT_CLINIC_OR_DEPARTMENT_OTHER): Payer: Medicare Other | Attending: Plastic Surgery

## 2014-02-16 ENCOUNTER — Encounter (HOSPITAL_COMMUNITY): Payer: Self-pay | Admitting: Pharmacy Technician

## 2014-02-20 ENCOUNTER — Encounter (HOSPITAL_COMMUNITY): Payer: Self-pay

## 2014-02-20 ENCOUNTER — Encounter (HOSPITAL_COMMUNITY)
Admission: RE | Admit: 2014-02-20 | Discharge: 2014-02-20 | Disposition: A | Discharge status: Home / Self Care | Payer: Medicare Other | Source: Ambulatory Visit | Attending: Orthopedic Surgery | Admitting: Orthopedic Surgery

## 2014-02-20 DIAGNOSIS — Z7982 Long term (current) use of aspirin: Secondary | ICD-10-CM | POA: Diagnosis not present

## 2014-02-20 DIAGNOSIS — R0602 Shortness of breath: Secondary | ICD-10-CM | POA: Diagnosis not present

## 2014-02-20 DIAGNOSIS — I10 Essential (primary) hypertension: Secondary | ICD-10-CM | POA: Diagnosis not present

## 2014-02-20 DIAGNOSIS — Z79899 Other long term (current) drug therapy: Secondary | ICD-10-CM | POA: Diagnosis not present

## 2014-02-20 DIAGNOSIS — F411 Generalized anxiety disorder: Secondary | ICD-10-CM | POA: Diagnosis not present

## 2014-02-20 DIAGNOSIS — K219 Gastro-esophageal reflux disease without esophagitis: Secondary | ICD-10-CM | POA: Diagnosis not present

## 2014-02-20 DIAGNOSIS — L97509 Non-pressure chronic ulcer of other part of unspecified foot with unspecified severity: Secondary | ICD-10-CM | POA: Diagnosis not present

## 2014-02-20 DIAGNOSIS — F329 Major depressive disorder, single episode, unspecified: Secondary | ICD-10-CM | POA: Diagnosis not present

## 2014-02-20 DIAGNOSIS — E1169 Type 2 diabetes mellitus with other specified complication: Secondary | ICD-10-CM | POA: Diagnosis not present

## 2014-02-20 DIAGNOSIS — E039 Hypothyroidism, unspecified: Secondary | ICD-10-CM | POA: Diagnosis not present

## 2014-02-20 DIAGNOSIS — G4730 Sleep apnea, unspecified: Secondary | ICD-10-CM | POA: Diagnosis not present

## 2014-02-20 DIAGNOSIS — F3289 Other specified depressive episodes: Secondary | ICD-10-CM | POA: Diagnosis not present

## 2014-02-20 DIAGNOSIS — Z794 Long term (current) use of insulin: Secondary | ICD-10-CM | POA: Diagnosis not present

## 2014-02-20 DIAGNOSIS — M869 Osteomyelitis, unspecified: Secondary | ICD-10-CM | POA: Diagnosis present

## 2014-02-20 DIAGNOSIS — I2510 Atherosclerotic heart disease of native coronary artery without angina pectoris: Secondary | ICD-10-CM | POA: Diagnosis not present

## 2014-02-20 HISTORY — DX: Depression, unspecified: F32.A

## 2014-02-20 HISTORY — DX: Hypothyroidism, unspecified: E03.9

## 2014-02-20 HISTORY — DX: Gastro-esophageal reflux disease without esophagitis: K21.9

## 2014-02-20 HISTORY — DX: Shortness of breath: R06.02

## 2014-02-20 HISTORY — DX: Sleep apnea, unspecified: G47.30

## 2014-02-20 HISTORY — DX: Anxiety disorder, unspecified: F41.9

## 2014-02-20 HISTORY — DX: Major depressive disorder, single episode, unspecified: F32.9

## 2014-02-20 LAB — CBC
HCT: 42.2 % (ref 39.0–52.0)
Hemoglobin: 14.2 g/dL (ref 13.0–17.0)
MCH: 31.3 pg (ref 26.0–34.0)
MCHC: 33.6 g/dL (ref 30.0–36.0)
MCV: 93 fL (ref 78.0–100.0)
Platelets: 216 10*3/uL (ref 150–400)
RBC: 4.54 MIL/uL (ref 4.22–5.81)
RDW: 13.4 % (ref 11.5–15.5)
WBC: 8.3 10*3/uL (ref 4.0–10.5)

## 2014-02-20 LAB — SURGICAL PCR SCREEN
MRSA, PCR: NEGATIVE
Staphylococcus aureus: NEGATIVE

## 2014-02-20 LAB — BASIC METABOLIC PANEL
Anion gap: 12 (ref 5–15)
BUN: 25 mg/dL — ABNORMAL HIGH (ref 6–23)
CHLORIDE: 101 meq/L (ref 96–112)
CO2: 26 meq/L (ref 19–32)
CREATININE: 1.95 mg/dL — AB (ref 0.50–1.35)
Calcium: 9.2 mg/dL (ref 8.4–10.5)
GFR calc non Af Amer: 33 mL/min — ABNORMAL LOW (ref >90)
GFR, EST AFRICAN AMERICAN: 38 mL/min — AB (ref >90)
Glucose, Bld: 116 mg/dL — ABNORMAL HIGH (ref 70–99)
Potassium: 5 mEq/L (ref 3.7–5.3)
SODIUM: 139 meq/L (ref 137–147)

## 2014-02-20 NOTE — Progress Notes (Signed)
stess test,ekg requested from Dr Mayford Knife.

## 2014-02-20 NOTE — Pre-Procedure Instructions (Signed)
[name]  02/20/2014   Your procedure is scheduled on:  02/22/14  Report to [facility] Admitting at 11 AM.  Call this number if you have problems the morning of surgery: [phone]   Remember:   Do not eat food or drink liquids after midnight.   Take these medicines the morning of surgery with A SIP OF WATER: amlodipine,eye drops,prozac,synthroid,protonix   Do not wear jewelry, make-up or nail polish.  Do not wear lotions, powders, or perfumes. You may wear deodorant.  Do not shave 48 hours prior to surgery. Men may shave face and neck.  Do not bring valuables to the hospital.  [facility] is not responsible                  for any belongings or valuables.               Contacts, dentures or bridgework may not be worn into surgery.  Leave suitcase in the car. After surgery it may be brought to your room.  For patients admitted to the hospital, discharge time is determined by your                treatment team.               Patients discharged the day of surgery will not be allowed to drive  home.  Name and phone number of your driver:   Special Instructions: Incentive Spirometry - Practice and bring it with you on the day of surgery.   Please read over the following fact sheets that you were given: Pain Booklet, Coughing and Deep Breathing, MRSA Information and Surgical Site Infection Prevention

## 2014-02-21 ENCOUNTER — Other Ambulatory Visit: Payer: Self-pay | Admitting: Physician Assistant

## 2014-02-21 MED ORDER — CHLORHEXIDINE GLUCONATE 4 % EX LIQD
60.0000 mL | Freq: Once | CUTANEOUS | Status: DC
Start: 2014-02-21 — End: 2014-02-22
  Filled 2014-02-21: qty 60

## 2014-02-21 MED ORDER — ACETAMINOPHEN 500 MG PO TABS
1000.0000 mg | ORAL_TABLET | Freq: Once | ORAL | Status: AC
  Administered 2014-02-22: 1000 mg via ORAL
  Filled 2014-02-21: qty 2

## 2014-02-21 NOTE — Progress Notes (Signed)
Notified pt of surgery time change and instructed him to be here at 11:45 AM tomorrow morning. He voiced understanding.

## 2014-02-21 NOTE — Progress Notes (Signed)
Anesthesia Chart Review:  Patient is a 68 year old male posted for right 4th and 5th toe ray amputation on 02/22/14 by Dr. Victorino Dike. 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  Case is posted for GA.  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 Craige Cotta), GERD, anxiety, depression, neck abscess s/p I&D, MRSA, SOB (non-ischemic stress test '14, felt r/t obesity), back surgery. BMI is consistent with obesity. PCP is Dr. Ivery Quale.  Cardiologist is Dr. Mayford Knife, last visit 07/05/13 with one year follow-up recommended. Pulmonologist is Dr. Sherene Sires.  Meds: Prozac, MVI, Norvasc, ASA 81 mg, Colace, Trusopt, Pepcid, Synthroid, nitro, Protonix.  Benicar was not listed, but patient says he receives samples for  tablets and takes 1/2 tablet daily.  EKG on 05/05/13 showed: SR, first degree AVB, RSR prime or QR pattern in V1 suggests RV conduction delay.    Nuclear stress test on 06/05/13 showed: Overall Impression: Low risk stress nuclear study with a fixed defect in the inferolateral wall c/w diaphragmatic attenuation. LV Ejection Fraction: Study not gated. LV Wall Motion: NA.   Echo on 05/24/13 showed:  - Left ventricle: The cavity size was normal. There was severe focal basal and moderate concentric hypertrophy. Systolic function was normal. The estimated ejection fraction was in the range of 55% to 60%. Wall motion was normal; there were no regional wall motion abnormalities. There was an increased relative contribution of atrial contraction to ventricular filling. Features are consistent with a pseudonormal left ventricular filling pattern, with concomitant abnormal relaxation and increased filling pressure (grade 2 diastolic dysfunction). - Mitral valve: Calcified annulus. - Left atrium: The atrium was mildly dilated.  Cardiac cath on 07/26/08 showed: Coronaries: Left main was normal. The LAD had mid 25% long plaque. The first diagonal  was moderate sized and normal. The second diagonal was moderate sized and normal. Circumflex was large. In the AV groove, there were diffuse 25% lesions. First obtuse marginal was moderate sized and branching. The second obtuse marginal was large and normal. Posterolateral was large with 30% proximal stenosis. The right coronary artery was dominant. There were diffuse luminal irregularities. PDA was moderate-sized and normal. Left ventriculogram was obtained in the RAO projection. The EF was 65% with normal wall motion.  CONCLUSION: Nonobstructive coronary artery disease.  CXR on 02/12/14 showed: Stable mild right basilar opacity is noted most consistent with scarring or possibly subsegmental atelectasis. No other abnormality seen.  PFTs on 06/28/13 showed: FVC 2.19 (45%), FEV1 1.68, (46%), FEF 25-75% 1.5 2 (47%). Interpretation: Severe restriction. Per Dr. Thurston Hole assessment: "His problem is c/w obesity complicated by hypercarbic resp failure with no evidence of any copd/ab  rec wt loss, sleep eval."  Home sleep study showed moderate OSA (see 10/15/13 notation by Dr. Craige Cotta).  Preoperative labs noted. BUN/Cr was 25/1.95, up from 16/1.5 on 02/27/13.  I called and left a message with Dr. Norval Gable nurse regarding plans for surgery and for follow-up recommendations.  I also faxed labs with confirmation. She called back after reviewing with Dr. Jarold Motto.  He recommended having patient stop Benicar.  I called and notified patient. I told him to contact Dr. Jarold Motto when he returns home from the hospital to ask about when/if he should resume and when to schedule a repeat lab visit. Patient said that he typically is seen every two months anyway, but would call as instructed.  He states Dr. Victorino Dike says he will see how he does in PACU before deciding if  he will be admitted post-operatively.  I'll check an ISTAT8 on arrival to ensure renal function is stable, if so then I would anticipate that he could proceed as  planned.    Velna Ochs [facility]/Anesthesiology Phone [phone] 02/21/2014 2:17 PM

## 2014-02-22 ENCOUNTER — Encounter (HOSPITAL_COMMUNITY): Payer: Medicare Other | Admitting: Vascular Surgery

## 2014-02-22 ENCOUNTER — Encounter (HOSPITAL_COMMUNITY): Payer: Self-pay | Admitting: Anesthesiology

## 2014-02-22 ENCOUNTER — Encounter (HOSPITAL_COMMUNITY)
Admission: RE | Disposition: A | Discharge status: Home / Self Care | Payer: Self-pay | Source: Ambulatory Visit | Attending: Orthopedic Surgery

## 2014-02-22 ENCOUNTER — Ambulatory Visit (HOSPITAL_COMMUNITY)
Admission: RE | Admit: 2014-02-22 | Discharge: 2014-02-23 | Disposition: A | Discharge status: Home / Self Care | Payer: Medicare Other | Source: Ambulatory Visit | Attending: Orthopedic Surgery | Admitting: Orthopedic Surgery

## 2014-02-22 ENCOUNTER — Ambulatory Visit (HOSPITAL_COMMUNITY): Payer: Medicare Other | Admitting: Anesthesiology

## 2014-02-22 DIAGNOSIS — E1169 Type 2 diabetes mellitus with other specified complication: Secondary | ICD-10-CM | POA: Diagnosis not present

## 2014-02-22 DIAGNOSIS — Z794 Long term (current) use of insulin: Secondary | ICD-10-CM | POA: Insufficient documentation

## 2014-02-22 DIAGNOSIS — F411 Generalized anxiety disorder: Secondary | ICD-10-CM | POA: Insufficient documentation

## 2014-02-22 DIAGNOSIS — G4730 Sleep apnea, unspecified: Secondary | ICD-10-CM | POA: Insufficient documentation

## 2014-02-22 DIAGNOSIS — Z79899 Other long term (current) drug therapy: Secondary | ICD-10-CM | POA: Insufficient documentation

## 2014-02-22 DIAGNOSIS — M86171 Other acute osteomyelitis, right ankle and foot: Secondary | ICD-10-CM

## 2014-02-22 DIAGNOSIS — Z7982 Long term (current) use of aspirin: Secondary | ICD-10-CM | POA: Insufficient documentation

## 2014-02-22 DIAGNOSIS — I2510 Atherosclerotic heart disease of native coronary artery without angina pectoris: Secondary | ICD-10-CM | POA: Insufficient documentation

## 2014-02-22 DIAGNOSIS — F3289 Other specified depressive episodes: Secondary | ICD-10-CM | POA: Insufficient documentation

## 2014-02-22 DIAGNOSIS — F329 Major depressive disorder, single episode, unspecified: Secondary | ICD-10-CM | POA: Insufficient documentation

## 2014-02-22 DIAGNOSIS — L97509 Non-pressure chronic ulcer of other part of unspecified foot with unspecified severity: Secondary | ICD-10-CM | POA: Insufficient documentation

## 2014-02-22 DIAGNOSIS — M869 Osteomyelitis, unspecified: Secondary | ICD-10-CM | POA: Insufficient documentation

## 2014-02-22 DIAGNOSIS — Z8739 Personal history of other diseases of the musculoskeletal system and connective tissue: Secondary | ICD-10-CM | POA: Diagnosis present

## 2014-02-22 DIAGNOSIS — R0602 Shortness of breath: Secondary | ICD-10-CM | POA: Insufficient documentation

## 2014-02-22 DIAGNOSIS — I10 Essential (primary) hypertension: Secondary | ICD-10-CM | POA: Insufficient documentation

## 2014-02-22 DIAGNOSIS — E039 Hypothyroidism, unspecified: Secondary | ICD-10-CM | POA: Insufficient documentation

## 2014-02-22 DIAGNOSIS — K219 Gastro-esophageal reflux disease without esophagitis: Secondary | ICD-10-CM | POA: Insufficient documentation

## 2014-02-22 HISTORY — PX: AMPUTATION: SHX166

## 2014-02-22 LAB — POCT I-STAT, CHEM 8
BUN: 22 mg/dL (ref 6–23)
CREATININE: 2.1 mg/dL — AB (ref 0.50–1.35)
Calcium, Ion: 1.16 mmol/L (ref 1.13–1.30)
Chloride: 104 mEq/L (ref 96–112)
GLUCOSE: 100 mg/dL — AB (ref 70–99)
HCT: 45 % (ref 39.0–52.0)
HEMOGLOBIN: 15.3 g/dL (ref 13.0–17.0)
Potassium: 5 mEq/L (ref 3.7–5.3)
SODIUM: 136 meq/L — AB (ref 137–147)
TCO2: 24 mmol/L (ref 0–100)

## 2014-02-22 LAB — GLUCOSE, CAPILLARY
Glucose-Capillary: 93 mg/dL (ref 70–99)
Glucose-Capillary: 115 mg/dL — ABNORMAL HIGH (ref 70–99)
Glucose-Capillary: 136 mg/dL — ABNORMAL HIGH (ref 70–99)

## 2014-02-22 SURGERY — AMPUTATION, FOOT, RAY
Anesthesia: General | Site: Foot | Laterality: Right

## 2014-02-22 MED ORDER — NITROGLYCERIN 0.4 MG SL SUBL: 0.4000 mg | SUBLINGUAL_TABLET | SUBLINGUAL | Status: DC | PRN

## 2014-02-22 MED ORDER — VANCOMYCIN HCL IN DEXTROSE 1-5 GM/200ML-% IV SOLN: 1000.0000 mg | Freq: Once | INTRAVENOUS | Status: DC

## 2014-02-22 MED ORDER — ONDANSETRON HCL 4 MG PO TABS: 4.0000 mg | ORAL_TABLET | Freq: Four times a day (QID) | ORAL | Status: DC | PRN

## 2014-02-22 MED ORDER — METOCLOPRAMIDE HCL 10 MG PO TABS: 5.0000 mg | ORAL_TABLET | Freq: Three times a day (TID) | ORAL | Status: DC | PRN

## 2014-02-22 MED ORDER — SODIUM CHLORIDE 0.9 % IV SOLN
INTRAVENOUS | Status: DC
  Administered 2014-02-22: 19:00:00 via INTRAVENOUS

## 2014-02-22 MED ORDER — VANCOMYCIN HCL 10 G IV SOLR
1750.0000 mg | INTRAVENOUS | Status: DC
  Filled 2014-02-22: qty 1750

## 2014-02-22 MED ORDER — HYDROMORPHONE HCL PF 1 MG/ML IJ SOLN: 0.5000 mg | INTRAMUSCULAR | Status: DC | PRN

## 2014-02-22 MED ORDER — VANCOMYCIN HCL IN DEXTROSE 1-5 GM/200ML-% IV SOLN
INTRAVENOUS | Status: AC
  Administered 2014-02-22: 1000 mg via INTRAVENOUS
  Filled 2014-02-22: qty 200

## 2014-02-22 MED ORDER — SENNA 8.6 MG PO TABS
1.0000 | ORAL_TABLET | Freq: Two times a day (BID) | ORAL | Status: DC
  Administered 2014-02-22 – 2014-02-23 (×2): 8.6 mg via ORAL
  Filled 2014-02-22 (×3): qty 1

## 2014-02-22 MED ORDER — FENTANYL CITRATE 0.05 MG/ML IJ SOLN
INTRAMUSCULAR | Status: AC
  Filled 2014-02-22: qty 5

## 2014-02-22 MED ORDER — OXYCODONE HCL 5 MG PO TABS
5.0000 mg | ORAL_TABLET | ORAL | Status: DC | PRN
Start: 2014-02-22 — End: 2014-02-23
  Filled 2014-02-22: qty 2

## 2014-02-22 MED ORDER — LACTATED RINGERS IV SOLN
INTRAVENOUS | Status: DC
  Administered 2014-02-22: 13:00:00 via INTRAVENOUS

## 2014-02-22 MED ORDER — LEVOTHYROXINE SODIUM 100 MCG PO TABS
100.0000 ug | ORAL_TABLET | Freq: Every day | ORAL | Status: DC
  Administered 2014-02-23: 100 ug via ORAL
  Filled 2014-02-22 (×2): qty 1

## 2014-02-22 MED ORDER — LIDOCAINE HCL (CARDIAC) 20 MG/ML IV SOLN
INTRAVENOUS | Status: AC
  Filled 2014-02-22: qty 5

## 2014-02-22 MED ORDER — GLYCOPYRROLATE 0.2 MG/ML IJ SOLN
INTRAMUSCULAR | Status: DC | PRN
  Administered 2014-02-22: .2 mg via INTRAVENOUS

## 2014-02-22 MED ORDER — ONDANSETRON HCL 4 MG/2ML IJ SOLN
INTRAMUSCULAR | Status: DC | PRN
  Administered 2014-02-22: 4 mg via INTRAVENOUS

## 2014-02-22 MED ORDER — METOCLOPRAMIDE HCL 5 MG/ML IJ SOLN: 5.0000 mg | Freq: Three times a day (TID) | INTRAMUSCULAR | Status: DC | PRN

## 2014-02-22 MED ORDER — AMLODIPINE BESYLATE 5 MG PO TABS
5.0000 mg | ORAL_TABLET | Freq: Every day | ORAL | Status: DC
  Administered 2014-02-23: 5 mg via ORAL
  Filled 2014-02-22: qty 1

## 2014-02-22 MED ORDER — ONDANSETRON HCL 4 MG/2ML IJ SOLN: 4.0000 mg | Freq: Four times a day (QID) | INTRAMUSCULAR | Status: DC | PRN

## 2014-02-22 MED ORDER — VANCOMYCIN HCL IN DEXTROSE 1-5 GM/200ML-% IV SOLN
1000.0000 mg | Freq: Once | INTRAVENOUS | Status: AC
  Administered 2014-02-22: 1000 mg via INTRAVENOUS
  Filled 2014-02-22: qty 200

## 2014-02-22 MED ORDER — PROPOFOL 10 MG/ML IV BOLUS
INTRAVENOUS | Status: AC
  Filled 2014-02-22: qty 20

## 2014-02-22 MED ORDER — BACITRACIN ZINC 500 UNIT/GM EX OINT
TOPICAL_OINTMENT | CUTANEOUS | Status: AC
  Filled 2014-02-22: qty 15

## 2014-02-22 MED ORDER — INSULIN ASPART 100 UNIT/ML ~~LOC~~ SOLN
0.0000 [IU] | Freq: Three times a day (TID) | SUBCUTANEOUS | Status: DC
  Administered 2014-02-23: 2 [IU] via SUBCUTANEOUS

## 2014-02-22 MED ORDER — DOCUSATE SODIUM 100 MG PO CAPS: 100.0000 mg | ORAL_CAPSULE | Freq: Every day | ORAL | Status: DC

## 2014-02-22 MED ORDER — FLUOXETINE HCL 10 MG PO CAPS
10.0000 mg | ORAL_CAPSULE | Freq: Every day | ORAL | Status: DC
  Administered 2014-02-23: 10 mg via ORAL
  Filled 2014-02-22: qty 1

## 2014-02-22 MED ORDER — DORZOLAMIDE HCL 2 % OP SOLN
1.0000 [drp] | Freq: Two times a day (BID) | OPHTHALMIC | Status: DC
  Filled 2014-02-22: qty 10

## 2014-02-22 MED ORDER — FAMOTIDINE 20 MG PO TABS
20.0000 mg | ORAL_TABLET | Freq: Every day | ORAL | Status: DC
  Administered 2014-02-22: 20 mg via ORAL
  Filled 2014-02-22 (×2): qty 1

## 2014-02-22 MED ORDER — DOCUSATE SODIUM 100 MG PO CAPS
100.0000 mg | ORAL_CAPSULE | Freq: Two times a day (BID) | ORAL | Status: DC
  Administered 2014-02-22 – 2014-02-23 (×2): 100 mg via ORAL
  Filled 2014-02-22 (×3): qty 1

## 2014-02-22 MED ORDER — PANTOPRAZOLE SODIUM 40 MG PO TBEC
40.0000 mg | DELAYED_RELEASE_TABLET | Freq: Every day | ORAL | Status: DC
  Administered 2014-02-23: 40 mg via ORAL
  Filled 2014-02-22: qty 1

## 2014-02-22 MED ORDER — 0.9 % SODIUM CHLORIDE (POUR BTL) OPTIME
TOPICAL | Status: DC | PRN
  Administered 2014-02-22: 1000 mL

## 2014-02-22 MED ORDER — FENTANYL CITRATE 0.05 MG/ML IJ SOLN
INTRAMUSCULAR | Status: DC | PRN
  Administered 2014-02-22: 100 ug via INTRAVENOUS
  Administered 2014-02-22: 25 ug via INTRAVENOUS

## 2014-02-22 MED ORDER — MIDAZOLAM HCL 5 MG/5ML IJ SOLN
INTRAMUSCULAR | Status: DC | PRN
  Administered 2014-02-22: 2 mg via INTRAVENOUS

## 2014-02-22 MED ORDER — ADULT MULTIVITAMIN W/MINERALS CH
1.0000 | ORAL_TABLET | Freq: Every day | ORAL | Status: DC
Start: 2014-02-23 — End: 2014-02-23
  Administered 2014-02-23: 1 via ORAL
  Filled 2014-02-22: qty 1

## 2014-02-22 MED ORDER — ENOXAPARIN SODIUM 40 MG/0.4ML ~~LOC~~ SOLN
40.0000 mg | SUBCUTANEOUS | Status: DC
  Administered 2014-02-23: 40 mg via SUBCUTANEOUS
  Filled 2014-02-22 (×2): qty 0.4

## 2014-02-22 MED ORDER — SODIUM CHLORIDE 0.9 % IV SOLN: INTRAVENOUS | Status: DC

## 2014-02-22 MED ORDER — PROPOFOL 10 MG/ML IV BOLUS
INTRAVENOUS | Status: DC | PRN
  Administered 2014-02-22: 140 mg via INTRAVENOUS

## 2014-02-22 MED ORDER — MIDAZOLAM HCL 2 MG/2ML IJ SOLN
INTRAMUSCULAR | Status: AC
  Filled 2014-02-22: qty 2

## 2014-02-22 SURGICAL SUPPLY — 53 items
BLADE LONG MED 31MMX9MM (MISCELLANEOUS) ×1
BLADE LONG MED 31X9 (MISCELLANEOUS) ×2 IMPLANT
BNDG CMPR 9X4 STRL LF SNTH (GAUZE/BANDAGES/DRESSINGS) ×1
BNDG COHESIVE 4X5 TAN STRL (GAUZE/BANDAGES/DRESSINGS) ×3 IMPLANT
BNDG COHESIVE 6X5 TAN STRL LF (GAUZE/BANDAGES/DRESSINGS) ×3 IMPLANT
BNDG ESMARK 4X9 LF (GAUZE/BANDAGES/DRESSINGS) ×3 IMPLANT
CANISTER SUCT 3000ML (MISCELLANEOUS) ×3 IMPLANT
CHLORAPREP W/TINT 26ML (MISCELLANEOUS) ×3 IMPLANT
CUFF TOURNIQUET SINGLE 34IN LL (TOURNIQUET CUFF) IMPLANT
CUFF TOURNIQUET SINGLE 44IN (TOURNIQUET CUFF) IMPLANT
DRAPE U-SHAPE 47X51 STRL (DRAPES) ×6 IMPLANT
DRSG ADAPTIC 3X8 NADH LF (GAUZE/BANDAGES/DRESSINGS) ×3 IMPLANT
ELECT REM PT RETURN 9FT ADLT (ELECTROSURGICAL) ×3
ELECTRODE REM PT RTRN 9FT ADLT (ELECTROSURGICAL) ×1 IMPLANT
GAUZE SPONGE 4X4 12PLY STRL (GAUZE/BANDAGES/DRESSINGS) ×3 IMPLANT
GLOVE BIO SURGEON STRL SZ 6.5 (GLOVE) ×1 IMPLANT
GLOVE BIO SURGEON STRL SZ7 (GLOVE) ×6 IMPLANT
GLOVE BIO SURGEON STRL SZ8 (GLOVE) ×4 IMPLANT
GLOVE BIO SURGEONS STRL SZ 6.5 (GLOVE) ×1
GLOVE BIOGEL PI IND STRL 7.0 (GLOVE) IMPLANT
GLOVE BIOGEL PI IND STRL 7.5 (GLOVE) ×1 IMPLANT
GLOVE BIOGEL PI IND STRL 8 (GLOVE) ×1 IMPLANT
GLOVE BIOGEL PI INDICATOR 7.0 (GLOVE) ×2
GLOVE BIOGEL PI INDICATOR 7.5 (GLOVE) ×2
GLOVE BIOGEL PI INDICATOR 8 (GLOVE) ×2
GLOVE SURG SS PI 6.0 STRL IVOR (GLOVE) ×2 IMPLANT
GLOVE SURG SS PI 7.0 STRL IVOR (GLOVE) ×2 IMPLANT
GOWN STRL REUS W/ TWL LRG LVL3 (GOWN DISPOSABLE) ×1 IMPLANT
GOWN STRL REUS W/ TWL XL LVL3 (GOWN DISPOSABLE) ×1 IMPLANT
GOWN STRL REUS W/TWL LRG LVL3 (GOWN DISPOSABLE) ×3
GOWN STRL REUS W/TWL XL LVL3 (GOWN DISPOSABLE) ×3
KIT BASIN OR (CUSTOM PROCEDURE TRAY) ×3 IMPLANT
KIT ROOM TURNOVER OR (KITS) ×3 IMPLANT
NS IRRIG 1000ML POUR BTL (IV SOLUTION) ×3 IMPLANT
PACK ORTHO EXTREMITY (CUSTOM PROCEDURE TRAY) ×3 IMPLANT
PAD ABD 8X10 STRL (GAUZE/BANDAGES/DRESSINGS) ×2 IMPLANT
PAD ARMBOARD 7.5X6 YLW CONV (MISCELLANEOUS) ×6 IMPLANT
PAD CAST 4YDX4 CTTN HI CHSV (CAST SUPPLIES) ×1 IMPLANT
PADDING CAST COTTON 4X4 STRL (CAST SUPPLIES) ×3
SPECIMEN JAR SMALL (MISCELLANEOUS) ×3 IMPLANT
SPONGE GAUZE 4X4 12PLY STER LF (GAUZE/BANDAGES/DRESSINGS) ×2 IMPLANT
SPONGE LAP 18X18 X RAY DECT (DISPOSABLE) ×3 IMPLANT
STAPLER VISISTAT 35W (STAPLE) IMPLANT
STOCKINETTE IMPERVIOUS LG (DRAPES) IMPLANT
SUCTION FRAZIER TIP 10 FR DISP (SUCTIONS) ×3 IMPLANT
SUT ETHILON 2 0 PSLX (SUTURE) ×4 IMPLANT
SUT VIC AB 2-0 CT1 36 (SUTURE) ×2 IMPLANT
TOWEL OR 17X24 6PK STRL BLUE (TOWEL DISPOSABLE) ×3 IMPLANT
TOWEL OR 17X26 10 PK STRL BLUE (TOWEL DISPOSABLE) ×3 IMPLANT
TUBE CONNECTING 12'X1/4 (SUCTIONS) ×1
TUBE CONNECTING 12X1/4 (SUCTIONS) ×2 IMPLANT
UNDERPAD 30X30 INCONTINENT (UNDERPADS AND DIAPERS) ×3 IMPLANT
WATER STERILE IRR 1000ML POUR (IV SOLUTION) ×3 IMPLANT

## 2014-02-22 NOTE — Transfer of Care (Signed)
Immediate Anesthesia Transfer of Care Note  Patient: [name]  Procedure(s) Performed: Procedure(s): AMPUTATION RAY RIGHT FOURTH AND FIFTH (Right)  Patient Location: PACU  Anesthesia Type:General  Level of Consciousness: awake and alert   Airway & Oxygen Therapy: Patient Spontanous Breathing and Patient connected to nasal cannula oxygen  Post-op Assessment: Report given to PACU RN, Post -op Vital signs reviewed and stable and Patient moving all extremities  Post vital signs: Reviewed and stable  Complications: No apparent anesthesia complications

## 2014-02-22 NOTE — Brief Op Note (Signed)
02/22/2014  2:41 PM  PATIENT:  [name]  68 y.o. male  PRE-OPERATIVE DIAGNOSIS:   right fifth MT osteomylitis  POST-OPERATIVE DIAGNOSIS:   right fifth MT osteomylitis  Procedure(s): 1.  Right 4th ray amputation 2.  Right 5th ray amputation  SURGEON:  Toni Arthurs, MD  ASSISTANT: Lucretia Kern, PA-C  ANESTHESIA:   General  EBL:  minimal   TOURNIQUET:  approx 20 min with ankle esmarch  COMPLICATIONS:  None apparent  DISPOSITION:  Extubated, awake and stable to recovery.  DICTATION ID:  [id number]

## 2014-02-22 NOTE — Progress Notes (Signed)
ANTIBIOTIC CONSULT NOTE - INITIAL  Pharmacy Consult:  Vancomycin Indication:  Osteomyelitis   Allergies  Allergen Reactions  . Penicillins     REACTION: swollen tongue and eyes    Patient Measurements: Height: 6' (182.9 cm) Weight: 275 lb 4.8 oz (124.875 kg) IBW/kg (Calculated) : 77.6  Vital Signs: Temp: 98 F (36.7 C) (09/10 1448) Temp src: Oral (09/10 1217) BP: 143/57 mmHg (09/10 1545) Pulse Rate: 50 (09/10 1545) Intake/Output from this shift: Total I/O In: 500 [I.V.:500] Out: -   Labs:  Recent Labs  02/20/14 1508 02/22/14 1301  WBC 8.3  --   HGB 14.2 15.3  PLT 216  --   CREATININE 1.95* 2.10*   Estimated Creatinine Clearance: 44.7 ml/min (by C-G formula based on Cr of 2.1). No results found for this basename: VANCOTROUGH, Leodis Binet, VANCORANDOM, GENTTROUGH, GENTPEAK, GENTRANDOM, TOBRATROUGH, TOBRAPEAK, TOBRARND, AMIKACINPEAK, AMIKACINTROU, AMIKACIN,  in the last 72 hours   Microbiology: Recent Results (from the past 720 hour(s))  SURGICAL PCR SCREEN     Status: None   Collection Time    02/20/14  3:08 PM      Result Value Ref Range Status   MRSA, PCR NEGATIVE  NEGATIVE Final   Staphylococcus aureus NEGATIVE  NEGATIVE Final   Comment:            The Xpert SA Assay (FDA     approved for NASAL specimens     in patients over 70 years of age),     is one component of     a comprehensive surveillance     program.  Test performance has     been validated by [employer] for patients greater     than or equal to 37 year old.     It is not intended     to diagnose infection nor to     guide or monitor treatment.    Medical History: Past Medical History  Diagnosis Date  . Neck abscess   . Diabetes mellitus   . Hypertension   . MRSA (methicillin resistant Staphylococcus aureus)   . Coronary artery disease cath 2006    nonobstructive ASCAD with 30% RCA  . Shortness of breath   . Sleep apnea     cpap  . Hypothyroidism   . Depression   . GERD  (gastroesophageal reflux disease)   . Anxiety      Assessment: 68 YOM s/p right 4th and 5th ray amputation due to osteomyelitis and Pharmacy consulted to initiate vancomycin post-op.  Baseline labs reviewed.  Noted patient received vancomycin 1gm around 1330 today.   Goal of Therapy:  Vancomycin trough level 15-20 mcg/ml   Plan:  - Vanc 1gm IV x 1 for a total of 2gm load today, then  IV Q24H - Monitor renal fxn, clinical progress, vanc trough if indicated    Lance Huaracha D. Laney Potash, PharmD, BCPS Pager:  [phone] 02/22/2014, 4:43 PM

## 2014-02-22 NOTE — Anesthesia Preprocedure Evaluation (Addendum)
Anesthesia Evaluation  Patient identified by MRN, date of birth, ID band Patient awake    Reviewed: Allergy & Precautions, H&P , NPO status , Patient's Chart, lab work & pertinent test results  History of Anesthesia Complications Negative for: history of anesthetic complications  Airway Mallampati: II TM Distance: >3 FB Neck ROM: Full    Dental  (+) Edentulous Lower, Edentulous Upper, Dental Advisory Given   Pulmonary sleep apnea , former smoker,    Pulmonary exam normal       Cardiovascular hypertension, Pt. on medications + CAD and + DOE  Echo on 05/24/13 showed:   - Left ventricle: The cavity size was normal. There was severe focal basal and moderate concentric hypertrophy. Systolic function was normal. The estimated ejection fraction was in the range of 55% to 60%. Wall motion was normal; there were no regional wall motion abnormalities. There was an increased relative contribution of atrial contraction to ventricular filling. Features are consistent with a pseudonormal left ventricular filling pattern, with concomitant abnormal relaxation and increased filling pressure (grade 2 diastolic dysfunction). - Mitral valve: Calcified annulus. - Left atrium: The atrium was mildly dilated.    Neuro/Psych PSYCHIATRIC DISORDERS Anxiety Depression negative neurological ROS     GI/Hepatic GERD-  ,  Endo/Other  diabetes  Renal/GU Renal InsufficiencyRenal disease     Musculoskeletal   Abdominal   Peds  Hematology   Anesthesia Other Findings   Reproductive/Obstetrics                         Anesthesia Physical Anesthesia Plan  ASA: III  Anesthesia Plan:    Post-op Pain Management:    Induction: Intravenous  Airway Management Planned: LMA  Additional Equipment:   Intra-op Plan:   Post-operative Plan: Extubation in OR  Informed Consent:   Dental advisory given  Plan Discussed with: CRNA,  Anesthesiologist and Surgeon  Anesthesia Plan Comments:        Anesthesia Quick Evaluation

## 2014-02-22 NOTE — H&P (Signed)
[name] is an 68 y.o. male.   Chief Complaint:  Right foot ulcer HPI:  68 y/o male with PMH of diabetes and chronic diabetic foot ulcer presents today for operative treatment.  MRI shows osteomyelitis of the right 5th ray deep to the ulcer.  He presents now for right 4th and 5th ray amputations.  Past Medical History  Diagnosis Date  . Neck abscess   . Diabetes mellitus   . Hypertension   . MRSA (methicillin resistant Staphylococcus aureus)   . Coronary artery disease cath 2006    nonobstructive ASCAD with 30% RCA  . Shortness of breath   . Sleep apnea     cpap  . Hypothyroidism   . Depression   . GERD (gastroesophageal reflux disease)   . Anxiety     Past Surgical History  Procedure Laterality Date  . Back surgery    . Abscess drainage      Family History  Problem Relation Age of Onset  . Heart disease Mother   . Heart disease Father    Social History:  reports that he quit smoking about 40 years ago. His smoking use included Cigarettes. He has a 5 pack-year smoking history. He has never used smokeless tobacco. He reports that he does not drink alcohol or use illicit drugs.  Allergies:  Allergies  Allergen Reactions  . Penicillins     REACTION: swollen tongue and eyes    Medications Prior to Admission  Medication Sig Dispense Refill  . amLODipine (NORVASC) 5 MG tablet Take 5 mg by mouth daily.       Marland Kitchen aspirin EC 81 MG tablet Take 81 mg by mouth daily.      Marland Kitchen docusate sodium (COLACE) 100 MG capsule Take 100 mg by mouth daily.      . dorzolamide (TRUSOPT) 2 % ophthalmic solution Place 1 drop into both eyes 2 (two) times daily.       . famotidine (PEPCID) 20 MG tablet Take 20 mg by mouth at bedtime.      Marland Kitchen FLUoxetine (PROZAC) 10 MG capsule Take 10 mg by mouth daily.       Marland Kitchen levothyroxine (SYNTHROID, LEVOTHROID) 100 MCG tablet Take 100 mcg by mouth daily.      . Multiple Vitamin (MULITIVITAMIN WITH MINERALS) TABS Take 1 tablet by mouth daily.      .  nitroGLYCERIN (NITROSTAT) 0.4 MG SL tablet Place 0.4 mg under the tongue every 5 (five) minutes as needed for chest pain.      Marland Kitchen NOVOLIN 70/30 (70-30) 100 UNIT/ML injection Inject 50-60 Units into the skin 2 (two) times daily with a meal. According to sliding scale.      . pantoprazole (PROTONIX) 40 MG tablet Take 40 mg by mouth daily.        Results for orders placed during the hospital encounter of 02/22/14 (from the past 48 hour(s))  GLUCOSE, CAPILLARY     Status: None   Collection Time    02/22/14 12:16 PM      Result Value Ref Range   Glucose-Capillary 93  70 - 99 mg/dL  POCT I-STAT, CHEM 8     Status: Abnormal   Collection Time    02/22/14  1:01 PM      Result Value Ref Range   Sodium 136 (*) 137 - 147 mEq/L   Potassium 5.0  3.7 - 5.3 mEq/L   Chloride 104  96 - 112 mEq/L   BUN 22  6 - 23  mg/dL   Creatinine, Ser 1.61 (*) 0.50 - 1.35 mg/dL   Glucose, Bld 096 (*) 70 - 99 mg/dL   Calcium, Ion 0.45  4.09 - 1.30 mmol/L   TCO2 24  0 - 100 mmol/L   Hemoglobin 15.3  13.0 - 17.0 g/dL   HCT 81.1  91.4 - 78.2 %   No results found.  ROS  No recent f/c/n/v/wt loss  Blood pressure 150/54, pulse 99, temperature 98 F (36.7 C), temperature source Oral, resp. rate 18, height 6' (1.829 m), weight 124.875 kg (275 lb 4.8 oz), SpO2 99.00%. Physical Exam  wn wd male in nad.  A and O x 4.  Mood and affect normal.  EOMI.  resp unlabored.  R foot with 2 cm plantar ulcer with granulation tissue.  No purulence.  Sens to LT diminished at the forefoot.  5/5 strength in PF and DF of the ankle and toes.  No lymphadenopathy.   Assessment/Plan R 5th metatarsal osteomyelitis - to OR for right foot 4th and 5th ray amputation.  The risks and benefits of the alternative treatment options have been discussed in detail.  The patient wishes to proceed with surgery and specifically understands risks of bleeding, infection, nerve damage, blood clots, need for additional surgery, amputation and death.   Angelina Neece,  Elisabel Hanover 2014/02/27, 1:28 PM

## 2014-02-22 NOTE — Anesthesia Postprocedure Evaluation (Signed)
Anesthesia Post Note  Patient: [name]  Procedure(s) Performed: Procedure(s) (LRB): AMPUTATION RAY RIGHT FOURTH AND FIFTH (Right)  Anesthesia type: general  Patient location: PACU  Post pain: Pain level controlled  Post assessment: Patient's Cardiovascular Status Stable  Last Vitals:  Filed Vitals:   02/22/14 1545  BP: 143/57  Pulse: 50  Temp:   Resp: 20    Post vital signs: Reviewed and stable  Level of consciousness: sedated  Complications: No apparent anesthesia complications

## 2014-02-23 DIAGNOSIS — M869 Osteomyelitis, unspecified: Secondary | ICD-10-CM | POA: Diagnosis not present

## 2014-02-23 LAB — BASIC METABOLIC PANEL
Anion gap: 10 (ref 5–15)
BUN: 21 mg/dL (ref 6–23)
CO2: 27 mEq/L (ref 19–32)
Calcium: 9 mg/dL (ref 8.4–10.5)
Chloride: 98 mEq/L (ref 96–112)
Creatinine, Ser: 1.99 mg/dL — ABNORMAL HIGH (ref 0.50–1.35)
GFR calc Af Amer: 37 mL/min — ABNORMAL LOW (ref >90)
GFR, EST NON AFRICAN AMERICAN: 32 mL/min — AB (ref >90)
GLUCOSE: 171 mg/dL — AB (ref 70–99)
Potassium: 5.6 mEq/L — ABNORMAL HIGH (ref 3.7–5.3)
Sodium: 135 mEq/L — ABNORMAL LOW (ref 137–147)

## 2014-02-23 LAB — GLUCOSE, CAPILLARY
Glucose-Capillary: 164 mg/dL — ABNORMAL HIGH (ref 70–99)
Glucose-Capillary: 147 mg/dL — ABNORMAL HIGH (ref 70–99)

## 2014-02-23 MED ORDER — HYDROCODONE-ACETAMINOPHEN 5-325 MG PO TABS: 1.0000 | ORAL_TABLET | Freq: Four times a day (QID) | ORAL | Status: DC | PRN

## 2014-02-23 NOTE — Op Note (Signed)
NAME:  [name], AKE NO.:  [id number]  MEDICAL RECORD NO.:  [id number]  LOCATION:  [id number]                        FACILITY:  [facility]  PHYSICIAN:  Toni Arthurs, MD        DATE OF BIRTH:  Mar 31, 1946  DATE OF PROCEDURE:  02/22/2014 DATE OF DISCHARGE:                              OPERATIVE REPORT   PREOPERATIVE DIAGNOSIS:  Right fifth metatarsal osteomyelitis and right forefoot diabetic foot ulcer.  POSTOPERATIVE DIAGNOSIS:  Right fifth metatarsal osteomyelitis and right forefoot diabetic foot ulcer.  PROCEDURES: 1. Right fourth ray amputation. 2. Right fifth ray amputation.  SURGEON:  Toni Arthurs, MD.  ASSISTANT:  Lucretia Kern, PA-C.  ANESTHESIA:  General.  ESTIMATED BLOOD LOSS:  Minimal.  TOURNIQUET:  Approximately 20 minutes with an ankle Esmarch.  COMPLICATIONS:  None apparent.  DISPOSITION:  Extubated awake and stable to recovery.  INDICATIONS FOR PROCEDURE:  The patient is a 68 year old male with past medical history significant for diabetes.  He has a nonhealing plantar ulcer that has been present for many months.  MRI reveals osteomyelitis of the fifth metatarsal head and the fifth toe.  He presents now for operative treatment of this nonhealing ulcer and underlying bone infection.  He understands the risks and benefits, the alternative treatment options, and elects surgical treatment.  He specifically understands risks of bleeding, infection, nerve damage, blood clots, need for additional surgery, continued pain, amputation, and death.  PROCEDURE IN DETAIL:  After preoperative consent was obtained, the correct operative site was identified.  The patient was brought to the operating room and placed on the operating table.  General anesthesia was induced.  Preoperative antibiotics were administered.  Surgical time- out was taken.  The right lower extremity was prepped and draped in standard sterile fashion.  A racquet style incision was marked  around the bases of the fourth and fifth toes incorporating the plantar ulcer within the marked incision.  Foot was exsanguinated and the Esmarch tourniquet was wrapped around the ankle.  The previously marked incision was made, and sharp dissection was carried down through skin subcutaneous tissue.  Subperiosteal dissection was carried along the fourth and fifth metatarsals.  Both the fourth and fifth toes were disarticulated through the MTP joints.  The proximal phalanx of the fifth toe basically fell apart due to the soft osteomyelitic nature of the bone.  The specimen was obtained from this area of bone and soft tissue and sent to Microbiology for aerobic and anaerobic culture.  The neck and head of the fifth metatarsal was also noted to be extensively involved with osteomyelitis.  Subperiosteal dissection was carried up to the base of the fifth metatarsal.  An oscillating saw was then used to cut through the fifth metatarsal leaving approximately 20% at the base of the fifth metatarsal.  The fourth metatarsal was similarly cut and removed.  The wound was irrigated copiously.  The remaining segments of fourth and fifth metatarsal bone appeared healthy.  Wound was irrigated copiously.  All necrotic and nonviable tissue was removed sharply with a scalpel and forceps.  The wound was then approximated at the deep soft tissues with simple sutures of 2-0 Vicryl.  The skin  incision was then closed with horizontal mattress sutures of 2-0 nylon.  Sterile dressings were applied followed by compression wrap.  The tourniquet was released at approximately 20 minutes.  The patient was awakened from anesthesia and transported to the recovery room in stable condition.  FOLLOWUP PLAN:  The patient will be weightbearing as tolerated on his right lower extremity in a flat postop shoe.  He will be observed overnight for pain control and for 24 hours of IV antibiotics.  He will be switched to oral  antibiotics upon discharge to home.  He will be on Lovenox for DVT prophylaxis starting tomorrow.  Lucretia Kern, PA-C was present, scrubbed for the duration of the case. Her assistance was essentially gaining maintaining exposure, performing the operation, closing and dressing the wounds.     Toni Arthurs, MD     JH/MEDQ  D:  02/22/2014  T:  02/22/2014  Job:  [id number]

## 2014-02-23 NOTE — Discharge Summary (Signed)
Physician Discharge Summary  Patient ID: [name] MRN: [id number] DOB/AGE: 1946-03-25 68 y.o.  Admit date: 02/22/2014 Discharge date: 02/23/2014  Admission Diagnoses: R foot osteomyelitis, R chronic diabetic foot ulcer, Diabetes Mellitus Type II  Discharge Diagnoses:  Active Problems:   Osteomyelitis of ankle or foot, acute   Discharged Condition: good  Hospital Course: Mr. Tornow presented to [facility] on 9/10 for operative treatment of his R chronic diabetic foot ulcer and osteomyelitis. He underwent a 4th and 5th ray amputation. He tolerated the procedure well and had no immediate post-op complications. Overnight and this morning, 9/11, he states that he has done well and has no complaints. He is in good spirits and is thankful for his surgery. He has no pain into his R foot and has no new symptoms. He has been ambulating on his RLE to go to the bathroom without difficulty. He has not received his flat post-op shoe yet, but has several at home. He denies any new HA, CP, SOB, N, V, fever, chills, calf pain or swelling. He has been on IV vanc since yesterday. He is eager to go home.   Consults: None  Significant Diagnostic Studies: radiology: MRI: completed as an outpatient, revealing osetomyelitis of the 4th and 5th ray of the R foot.   Treatments: antibiotics: vancomycin  Discharge Exam: Blood pressure 158/62, pulse 60, temperature 97.5 F (36.4 C), temperature source Oral, resp. rate 18, height 6' (1.829 m), weight 124.875 kg (275 lb 4.8 oz), SpO2 94.00%. WD/WN caucasian male in nad. A and O x4. Mood and Affect appropriate. EOMI. Respitations normal and unlabored. RLE in soft dressing. Dressings D/I with small amount of dried blood (pt reports bleeding from his IV site onto his dressings). NV intact with brisk capillary refill. 5/5 strength of toe extensors and flexors. distal sensation intact bilaterally. No lymphadenopathy.   Disposition: 01-Home or Self Care     Medication List     ASK your doctor about these medications       amLODipine 5 MG tablet  Commonly known as:  NORVASC  Take 5 mg by mouth daily.     aspirin EC 81 MG tablet  Take 81 mg by mouth daily.     docusate sodium 100 MG capsule  Commonly known as:  COLACE  Take 100 mg by mouth daily.     dorzolamide 2 % ophthalmic solution  Commonly known as:  TRUSOPT  Place 1 drop into both eyes 2 (two) times daily.     famotidine 20 MG tablet  Commonly known as:  PEPCID  Take 20 mg by mouth at bedtime.     FLUoxetine 10 MG capsule  Commonly known as:  PROZAC  Take 10 mg by mouth daily.     levothyroxine 100 MCG tablet  Commonly known as:  SYNTHROID, LEVOTHROID  Take 100 mcg by mouth daily.     multivitamin with minerals Tabs tablet  Take 1 tablet by mouth daily.     nitroGLYCERIN 0.4 MG SL tablet  Commonly known as:  NITROSTAT  Place 0.4 mg under the tongue every 5 (five) minutes as needed for chest pain.     NOVOLIN 70/30 (70-30) 100 UNIT/ML injection  Generic drug:  insulin NPH-regular Human  Inject 50-60 Units into the skin 2 (two) times daily with a meal. According to sliding scale.     pantoprazole 40 MG tablet  Commonly known as:  PROTONIX  Take 40 mg by mouth daily.  Follow-up Information   Follow up with Toni Arthurs, MD In 2 weeks. (For suture removal)    Specialty:  Orthopedic Surgery   Contact information:   [address] [place] [address] [phone]      D/C home WBAT on RLE in post-op shoe PO pain medications prn pain F/u in clinic in 2 weeks    Signed: Mamye Bolds HOWELLS 02/23/2014, 7:32 AM

## 2014-02-23 NOTE — Progress Notes (Signed)
Patient accidentally pulled out his IV when standing to use his urinal.  He was cleaned up and assessed.  Bleeding had stopped and he had concerns about another IV.  Per patient, the surgeon has said that he would go home today, therefore he does not want another IV started.

## 2014-02-23 NOTE — Discharge Instructions (Signed)
Toni Arthurs, MD [facility]  Please read the following information regarding your care after surgery.  Medications  You only need a prescription for the narcotic pain medicine (ex. oxycodone, Percocet, Norco).  All of the other medicines listed below are available over the counter. X acetominophen (Tylenol) 650 mg every 4-6 hours as you need for minor pain X hydrocodone as prescribed for moderate to severe pain   Narcotic pain medicine (ex. oxycodone, Percocet, Vicodin) will cause constipation.  To prevent this problem, take the following medicines while you are taking any pain medicine. X docusate sodium (Colace) 100 mg twice a day X senna (Senokot) 2 tablets twice a day     Weight Bearing X Bear weight when you are able on your operated leg or foot, wearing a flat post-op shoe.  Cast / Splint / Dressing X Keep your dressings clean and dry.  Dont put anything (coat hanger, pencil, etc) down inside of it.  If it gets damp, use a hair dryer on the cool setting to dry it.  If it gets soaked, call the office to schedule an appointment for a cast change.   After your dressing, cast or splint is removed; you may shower, but do not soak or scrub the wound.  Allow the water to run over it, and then gently pat it dry.  Swelling It is normal for you to have swelling where you had surgery.  To reduce swelling and pain, keep your toes above your nose for at least 3 days after surgery.  It may be necessary to keep your foot or leg elevated for several weeks.  If it hurts, it should be elevated.  Follow Up Call my office at [phone] when you are discharged from the hospital or surgery center to schedule an appointment to be seen two weeks after surgery.  Call my office at [phone] if you develop a fever >101.5 F, nausea, vomiting, bleeding from the surgical site or severe pain.         What to eat:  For your first meals, you should eat lightly; only small meals  initially.  If you do not have nausea, you may eat larger meals.  Avoid spicy, greasy and heavy food.    General Anesthesia, Adult, Care After  Refer to this sheet in the next few weeks. These instructions provide you with information on caring for yourself after your procedure. Your health care provider may also give you more specific instructions. Your treatment has been planned according to current medical practices, but problems sometimes occur. Call your health care provider if you have any problems or questions after your procedure.  WHAT TO EXPECT AFTER THE PROCEDURE  After the procedure, it is typical to experience:  Sleepiness.  Nausea and vomiting. HOME CARE INSTRUCTIONS  For the first 24 hours after general anesthesia:  Have a responsible person with you.  Do not drive a car. If you are alone, do not take public transportation.  Do not drink alcohol.  Do not take medicine that has not been prescribed by your health care provider.  Do not sign important papers or make important decisions.  You may resume a normal diet and activities as directed by your health care provider.  Change bandages (dressings) as directed.  If you have questions or problems that seem related to general anesthesia, call the hospital and ask for the anesthetist or anesthesiologist on call. SEEK MEDICAL CARE IF:  You have nausea and vomiting that continue the day  after anesthesia.  You develop a rash. SEEK IMMEDIATE MEDICAL CARE IF:  You have difficulty breathing.  You have chest pain.  You have any allergic problems. Document Released: 09/07/2000 Document Revised: 02/01/2013 Document Reviewed: 12/15/2012  [facility] Patient Information 2014 [place], [place].

## 2014-02-23 NOTE — Progress Notes (Signed)
Orthopedic Tech Progress Note Patient Details:  [name] January 11, 1946 [id number] Fit and applied post op shoe to RLE. Ortho Devices Type of Ortho Device: Postop shoe/boot Ortho Device/Splint Location: RLE Ortho Device/Splint Interventions: Application   [name] 02/23/2014, 8:36 AM

## 2014-02-25 LAB — TISSUE CULTURE

## 2014-02-26 ENCOUNTER — Encounter (HOSPITAL_COMMUNITY): Payer: Self-pay | Admitting: Orthopedic Surgery

## 2014-02-26 ENCOUNTER — Encounter (HOSPITAL_BASED_OUTPATIENT_CLINIC_OR_DEPARTMENT_OTHER): Payer: Medicare Other

## 2014-03-14 ENCOUNTER — Encounter (HOSPITAL_BASED_OUTPATIENT_CLINIC_OR_DEPARTMENT_OTHER): Payer: Medicare Other | Attending: Plastic Surgery

## 2014-03-14 DIAGNOSIS — L97509 Non-pressure chronic ulcer of other part of unspecified foot with unspecified severity: Secondary | ICD-10-CM | POA: Diagnosis not present

## 2014-03-14 DIAGNOSIS — E1169 Type 2 diabetes mellitus with other specified complication: Secondary | ICD-10-CM | POA: Insufficient documentation

## 2014-03-14 DIAGNOSIS — S98139A Complete traumatic amputation of one unspecified lesser toe, initial encounter: Secondary | ICD-10-CM | POA: Insufficient documentation

## 2014-03-14 DIAGNOSIS — Y835 Amputation of limb(s) as the cause of abnormal reaction of the patient, or of later complication, without mention of misadventure at the time of the procedure: Secondary | ICD-10-CM | POA: Diagnosis not present

## 2014-03-14 DIAGNOSIS — T8189XA Other complications of procedures, not elsewhere classified, initial encounter: Secondary | ICD-10-CM | POA: Insufficient documentation

## 2014-03-16 ENCOUNTER — Encounter: Payer: Self-pay | Admitting: Internal Medicine

## 2014-03-19 ENCOUNTER — Encounter (HOSPITAL_BASED_OUTPATIENT_CLINIC_OR_DEPARTMENT_OTHER): Payer: Medicare Other | Attending: Plastic Surgery

## 2014-03-19 DIAGNOSIS — L97519 Non-pressure chronic ulcer of other part of right foot with unspecified severity: Secondary | ICD-10-CM | POA: Insufficient documentation

## 2014-03-19 DIAGNOSIS — E11621 Type 2 diabetes mellitus with foot ulcer: Secondary | ICD-10-CM | POA: Insufficient documentation

## 2014-03-19 NOTE — Progress Notes (Signed)
[facility]  NAME:  [name], Troy               ACCOUNT NO.:  [id number]  MEDICAL RECORD NO.:  [id number]      DATE OF BIRTH:  30-Jun-1945  PHYSICIAN:  [name], DO       VISIT DATE:  03/19/2014                                  OFFICE VISIT   The patient is a very pleasant 68 year old male who is here for evaluation of his right  midfoot chronic diabetic ulcer leg with controlled diabetes right-sided.  He underwent a ray amputation of his fourth and fifth toe and has breakdown of the skin on the proximal aspect of the incision site.  He is taking some supplements and using Santyl on the area.  We did some debridement with curette and those notes are noted in the chart.  It does not appear to be infected.  He is on antibiotics from his orthopedic surgeon.  The area does appear to be clean.  His pulse is present.  His breathing is unlabored.  His heart rate is regular.  He at this point does not have pain and there does not appear to be any periwound infection.  Recommendation is for elevation, multivitamin, vitamin C, zinc, protein supplements, continued the antibiotics, Santyl, collagen, and follow up in 1 week.  If it does not show improvement in the next 1-2 weeks, we may need to go to the OR for more aggressive debridement and ACell placement, but we will try this first.     [name], DO     CS/MEDQ  D:  03/19/2014  T:  03/19/2014  Job:  [id number]

## 2014-03-26 DIAGNOSIS — L97519 Non-pressure chronic ulcer of other part of right foot with unspecified severity: Secondary | ICD-10-CM | POA: Diagnosis not present

## 2014-03-26 DIAGNOSIS — E11621 Type 2 diabetes mellitus with foot ulcer: Secondary | ICD-10-CM | POA: Diagnosis not present

## 2014-03-26 NOTE — Progress Notes (Signed)
[facility]  NAME:  [name], Davie               ACCOUNT NO.:  [id number]  MEDICAL RECORD NO.:  [id number]      DATE OF BIRTH:  February 06, 1946  PHYSICIAN:  [name], DO       VISIT DATE:  03/26/2014                                  OFFICE VISIT   The patient is a 68 year old male who is here for followup on his right thigh, leg, knee ulcers from chronic venous insufficiency, pressure, and lymphedema.  He is very pleasant.  He has been using a combination of collagen and lotion on the area with improvement.  Overall, they all look like they are doing much better, but he does have significant lymphedema in bilateral lower extremities.  PHYSICAL EXAMINATION:  GENERAL:  He is alert, oriented, cooperative, very pleasant. HEENT:  Pupils are equal.  Extraocular muscles are intact. RESPIRATIONS:  He does not have any breathing difficulty.  He is able to stand with his walker with some assistance.  He is open to using Juxta-Lite so we will order those.  Continue with local dressing care, collagen, zinc to help protect the surrounding skin.  Vitamins, multivitamins, offloading.  We will also apply for compression hose intermittent for home use, and we will see him back in 3 months.  There is no change in his medications or social history.  Review of systems is negative.     [name], DO     CS/MEDQ  D:  03/26/2014  T:  03/26/2014  Job:  [id number]

## 2014-04-09 DIAGNOSIS — L97519 Non-pressure chronic ulcer of other part of right foot with unspecified severity: Secondary | ICD-10-CM | POA: Diagnosis not present

## 2014-04-09 DIAGNOSIS — E11621 Type 2 diabetes mellitus with foot ulcer: Secondary | ICD-10-CM | POA: Diagnosis not present

## 2014-04-10 NOTE — Progress Notes (Signed)
[facility]  NAME:  [name], Arnet                    ACCOUNT NO.:  MEDICAL RECORD NO.:  [id number]      DATE OF BIRTH:  30-Jan-1946  PHYSICIAN:  [name], DO       VISIT DATE:  04/09/2014                                  OFFICE VISIT   The patient is a 68 year old male who is here for followup on his right lower extremity chronic ulcer.  He is doing very well and showing excellent signs of improvement with healing.  It is filling in and granulating and getting smaller.  He has been discharged from Dr. Victorino DikeHewitt and will continue to follow up here.  There is no change in his medications or social history.  On exam, he is alert, oriented, cooperative, not in any distress.  He is very pleasant.  His pupils are equal.  His breathing is unlabored.  His heart rate is regular.  The pulses are present in the lower extremities. The wound has a little bit of periwound redness as expected for an open wound, but I will continue to monitor this for any signs of worsening that would indicate a possible infection, but the granulation tissue is nice and pink and healing in with granulation and epithelialization.  I put in Endoform on today, and he will change this every other day.  We will see him back in 1 week.     [name], DO     CS/MEDQ  D:  04/09/2014  T:  04/10/2014  Job:  [id number]

## 2014-04-16 ENCOUNTER — Encounter (HOSPITAL_BASED_OUTPATIENT_CLINIC_OR_DEPARTMENT_OTHER): Payer: Medicare Other | Attending: Plastic Surgery

## 2014-04-16 DIAGNOSIS — E11621 Type 2 diabetes mellitus with foot ulcer: Secondary | ICD-10-CM | POA: Diagnosis present

## 2014-04-16 DIAGNOSIS — L97519 Non-pressure chronic ulcer of other part of right foot with unspecified severity: Secondary | ICD-10-CM | POA: Insufficient documentation

## 2014-04-17 NOTE — Progress Notes (Signed)
[facility]  NAME:  [name], [name]               ACCOUNT NO.:  [id number]  MEDICAL RECORD NO.:  [id number]      DATE OF BIRTH:  Nov 08, 1945  PHYSICIAN:  [name], DO       VISIT DATE:  04/16/2014                                  OFFICE VISIT   The patient is a 68 year old male who is here for followup on his right lower extremity ulcer, diabetic, Wagner 2.  He has been using Endoform and the area is improving remarkably.  He has a new wound on the great toe, which he thinks he banged over the weekend, but it is showing abrasion, but does not appear to be infected.  He is alert and oriented, and cooperative.  Review of systems is negative.  No change in medications.  His breathing is unlabored.  His heart rate is regular. There is no sign of distress or infection.  The wound is granulating and epithelializing.  Pulse is weak, but present.  Recommendation is to continue with the Endoform elevation, protein, multivitamin, blood sugar control and we will see him back in 2 weeks.     [name], DO     CS/MEDQ  D:  04/16/2014  T:  04/17/2014  Job:  [id number]

## 2014-04-30 DIAGNOSIS — L97519 Non-pressure chronic ulcer of other part of right foot with unspecified severity: Secondary | ICD-10-CM | POA: Diagnosis not present

## 2014-04-30 DIAGNOSIS — E11621 Type 2 diabetes mellitus with foot ulcer: Secondary | ICD-10-CM | POA: Diagnosis not present

## 2014-05-07 DIAGNOSIS — E11621 Type 2 diabetes mellitus with foot ulcer: Secondary | ICD-10-CM | POA: Diagnosis not present

## 2014-05-07 DIAGNOSIS — L97519 Non-pressure chronic ulcer of other part of right foot with unspecified severity: Secondary | ICD-10-CM | POA: Diagnosis not present

## 2014-05-11 NOTE — Progress Notes (Signed)
[facility]  NAME:  [name], [name]                    ACCOUNT NO.:  MEDICAL RECORD NO.:  [id number]      DATE OF BIRTH:  Jan 14, 1946  PHYSICIAN:  [name], DO       VISIT DATE:  05/07/2014                                  OFFICE VISIT   The patient is a 68 year old male, who is here for followup on his right foot ulcer.  He is doing extremely well.  He is alert and oriented, cooperative, not in any distress, a little sad with the holidays and 2- year anniversary of losing his son, but overall he is doing very well. There is no sign of infection.  The area is improving in its overall appearance, granulating and epithelializing.  He has been doing some dressing changes at home and seems to be doing very well with this.  I put in EndoForm on there and will have him change this at least every other day if not daily depending on his shower needs and he is in agreement for that and will see him back in 2 weeks.     [name], DO     CS/MEDQ  D:  05/07/2014  T:  05/08/2014  Job:  [id number]

## 2014-05-21 ENCOUNTER — Encounter (HOSPITAL_BASED_OUTPATIENT_CLINIC_OR_DEPARTMENT_OTHER): Payer: Medicare Other | Attending: Plastic Surgery

## 2014-05-21 ENCOUNTER — Encounter: Payer: Self-pay | Admitting: Cardiology

## 2014-05-21 DIAGNOSIS — L97511 Non-pressure chronic ulcer of other part of right foot limited to breakdown of skin: Secondary | ICD-10-CM | POA: Insufficient documentation

## 2014-05-21 DIAGNOSIS — E11621 Type 2 diabetes mellitus with foot ulcer: Secondary | ICD-10-CM | POA: Diagnosis not present

## 2014-05-21 NOTE — Progress Notes (Signed)
[facility]  NAME:  [name], [name]               ACCOUNT NO.:  [id number]  MEDICAL RECORD NO.:  [id number]      DATE OF BIRTH:  Apr 17, 1946  PHYSICIAN:  [name], DO       VISIT DATE:  05/21/2014                                  OFFICE VISIT   The patient is a 68 year old gentleman who is very pleasant.  He is here for followup on his right lower extremity diabetic foot Wagner 2 ulcer. He has been using Endoform on the area with marked improvement.  No change in his medications or social history.  On exam today, the area shows marked signs of improvement.  There is a pinpoint area that is still open, which is actually so small that the Endoform would not even place in that.  There is no sign of infection, no redness.  So, we will give triple antibiotic ointment, continue with elevation, protein, multivitamin, vitamin C, zinc and strict blood sugar control, and we will see him back in 1 week.     [name], DO     CS/MEDQ  D:  05/21/2014  T:  05/21/2014  Job:  [id number]

## 2014-05-28 DIAGNOSIS — E11621 Type 2 diabetes mellitus with foot ulcer: Secondary | ICD-10-CM | POA: Diagnosis not present

## 2014-05-28 DIAGNOSIS — L97511 Non-pressure chronic ulcer of other part of right foot limited to breakdown of skin: Secondary | ICD-10-CM | POA: Diagnosis not present

## 2014-05-29 NOTE — Progress Notes (Signed)
[facility]  NAME:  Izora RibasFARRAR, Khalil               ACCOUNT NO.:  [id number]  MEDICAL RECORD NO.:  [id number]      DATE OF BIRTH:  [birth year]  PHYSICIAN:  [name], DO       VISIT DATE:  05/28/2014                                  OFFICE VISIT   The patient is a 68 year old male who is here for followup on his right lower extremity foot ulcer, diabetic.  He is doing extremely well and very pleased with his progress.  There is no change in medications or social history.  Review of systems negative.  On exam; he is alert, oriented, cooperative, not in any distress.  He is extremely pleasant as usual.  No sign of infection.  The wound is actually completely healed up, and he is discharged.  We are here if he needs anything and he is aware of that.     [name], DO     CS/MEDQ  D:  05/28/2014  T:  05/29/2014  Job:  [id number]

## 2014-07-11 ENCOUNTER — Other Ambulatory Visit: Payer: Self-pay | Admitting: Internal Medicine

## 2014-07-12 ENCOUNTER — Ambulatory Visit: Payer: Self-pay | Admitting: Cardiology

## 2014-07-21 ENCOUNTER — Other Ambulatory Visit: Payer: Self-pay | Admitting: Internal Medicine

## 2014-07-26 ENCOUNTER — Encounter (INDEPENDENT_AMBULATORY_CARE_PROVIDER_SITE_OTHER): Payer: Medicare Other

## 2014-07-26 ENCOUNTER — Encounter: Payer: Self-pay | Admitting: Cardiology

## 2014-07-26 ENCOUNTER — Encounter: Payer: Self-pay | Admitting: *Deleted

## 2014-07-26 ENCOUNTER — Ambulatory Visit (INDEPENDENT_AMBULATORY_CARE_PROVIDER_SITE_OTHER): Payer: Medicare Other | Admitting: Cardiology

## 2014-07-26 VITALS — BP 152/70 | HR 45 | Ht 72.0 in | Wt 287.4 lb

## 2014-07-26 DIAGNOSIS — R0609 Other forms of dyspnea: Principal | ICD-10-CM

## 2014-07-26 DIAGNOSIS — I2583 Coronary atherosclerosis due to lipid rich plaque: Principal | ICD-10-CM

## 2014-07-26 DIAGNOSIS — I10 Essential (primary) hypertension: Secondary | ICD-10-CM

## 2014-07-26 DIAGNOSIS — I2510 Atherosclerotic heart disease of native coronary artery without angina pectoris: Secondary | ICD-10-CM

## 2014-07-26 DIAGNOSIS — I441 Atrioventricular block, second degree: Secondary | ICD-10-CM

## 2014-07-26 DIAGNOSIS — R000 Tachycardia, unspecified: Secondary | ICD-10-CM

## 2014-07-26 NOTE — Patient Instructions (Signed)
Your physician has recommended that you wear a 24-hour holter monitor. Holter monitors are medical devices that record the heart's electrical activity. Doctors most often use these monitors to diagnose arrhythmias. Arrhythmias are problems with the speed or rhythm of the heartbeat. The monitor is a small, portable device. You can wear one while you do your normal daily activities. This is usually used to diagnose what is causing palpitations/syncope (passing out).  Your physician wants you to follow-up in: 6 months with Dr. Mayford Knifeurner. You will receive a reminder letter in the mail two months in advance. If you don't receive a letter, please call our office to schedule the follow-up appointment.

## 2014-07-26 NOTE — Progress Notes (Signed)
Patient ID: [name], male   DOB: 11-01-1945, 69 y.o.   MRN: [id number] [facility] 24 hour holter monitor applied to patient.

## 2014-07-26 NOTE — Progress Notes (Signed)
Cardiology Office Note   Date:  07/26/2014   ID:  [name], DOB [birth year], MRN [id number]  PCP:  [name], MD  Cardiologist:   Quintella Reichert, MD   Chief Complaint  Patient presents with  . Hypertension  . Sleep Apnea  . Shortness of Breath      History of Present Illness: [name] is a 69 y.o. male with a history of DM, HTN, obesity and OSA followed by Dr. Sherene Sires who presents today for followup of SOB and DOE. He underwent nuclear stress test which was low risk showing a fixed defect in the inferior wall c/w diaphragmatic attenuation but no ischemia and normal LVF. 2D echo showed normal LVF with diastolic dysfunction and BNP was normal. He now presents back today for followup. He says that his SOB is about the same. He recently saw Dr. Sherene Sires and was told he does not have COPD. He felt that his obesity was the main factor in his SOB. He only has the SOB when he gets out of bed and then after eating breakfast he is fine. He does have some DOE when taking out trash which has not changed any in years.  He has been tired and fatigued some but he attributes it to age.  He denies any chest pain, LE edema, dizziness, palpitations or syncope.    Past Medical History  Diagnosis Date  . Neck abscess   . Diabetes mellitus   . Hypertension   . MRSA (methicillin resistant Staphylococcus aureus)   . Coronary artery disease cath 2006    nonobstructive ASCAD with 30% RCA  . Shortness of breath   . Sleep apnea     cpap  . Hypothyroidism   . Depression   . GERD (gastroesophageal reflux disease)   . Anxiety     Past Surgical History  Procedure Laterality Date  . Back surgery    . Abscess drainage    . Amputation Right 02/22/2014    Procedure: AMPUTATION RAY RIGHT FOURTH AND FIFTH;  Surgeon: Toni Arthurs, MD;  Location: [facility] OR;  Service: Orthopedics;  Laterality: Right;     Current Outpatient Prescriptions  Medication Sig Dispense Refill  . amLODipine  (NORVASC) 5 MG tablet Take 5 mg by mouth daily.     Marland Kitchen aspirin EC 81 MG tablet Take 81 mg by mouth daily.    . B-D INS SYR ULTRAFINE 1CC/30G 30G X 1/2" 1 ML MISC   12  . docusate sodium (COLACE) 100 MG capsule Take 100 mg by mouth daily.    . dorzolamide (TRUSOPT) 2 % ophthalmic solution Place 1 drop into both eyes 2 (two) times daily.     . famotidine (PEPCID) 20 MG tablet Take 20 mg by mouth at bedtime.    Marland Kitchen FLUoxetine (PROZAC) 10 MG capsule Take 10 mg by mouth daily.     Marland Kitchen levothyroxine (SYNTHROID, LEVOTHROID) 200 MCG tablet Take 200 mcg by mouth daily.  7  . Multiple Vitamin (MULITIVITAMIN WITH MINERALS) TABS Take 1 tablet by mouth daily.    . nitroGLYCERIN (NITROSTAT) 0.4 MG SL tablet Place 0.4 mg under the tongue every 5 (five) minutes as needed for chest pain.    . ONE TOUCH ULTRA TEST test strip   12  . pantoprazole (PROTONIX) 40 MG tablet Take 40 mg by mouth daily.     No current facility-administered medications for this visit.    Allergies:   Penicillins    Social History:  The patient  reports that he quit smoking about 41 years ago. His smoking use included Cigarettes. He has a 5 pack-year smoking history. He has never used smokeless tobacco. He reports that he does not drink alcohol or use illicit drugs.   Family History:  The patient's family history includes Heart disease in his father and mother.    ROS:  Please see the history of present illness.   Otherwise, review of systems are positive for none.   All other systems are reviewed and negative.    PHYSICAL EXAM: VS:  BP 152/70 mmHg  Pulse 45  Ht 6' (1.829 m)  Wt 287 lb 6.4 oz (130.364 kg)  BMI 38.97 kg/m2 , BMI Body mass index is 38.97 kg/(m^2). GEN: Well nourished, well developed, in no acute distress HEENT: normal Neck: no JVD, carotid bruits, or masses Cardiac: RRR; no murmurs, rubs, or gallops,no edema  Respiratory:  clear to auscultation bilaterally, normal work of breathing GI: soft, nontender,  nondistended, + BS MS: no deformity or atrophy Skin: warm and dry, no rash Neuro:  Strength and sensation are intact Psych: euthymic mood, full affect   EKG:  EKG was ordered today. The ekg ordered today demonstrates sinus bradycardia at 45bpm with Mobitz I second degree AV block   Recent Labs: 02/20/2014: Platelets 216 02/22/2014: Hemoglobin 15.3 02/23/2014: BUN 21; Creatinine 1.99*; Potassium 5.6*; Sodium 135*    Lipid Panel    Component Value Date/Time   CHOL  07/26/2008 0110    108        ATP III CLASSIFICATION:  <200     mg/dL   Desirable  161-096  mg/dL   Borderline High  >=045    mg/dL   High          TRIG 409* 07/26/2008 0110   HDL 18* 07/26/2008 0110   CHOLHDL 6.0 07/26/2008 0110   VLDL 75* 07/26/2008 0110   LDLCALC  07/26/2008 0110    15        Total Cholesterol/HDL:CHD Risk Coronary Heart Disease Risk Table                     Men   Women  1/2 Average Risk   3.4   3.3  Average Risk       5.0   4.4  2 X Average Risk   9.6   7.1  3 X Average Risk  23.4   11.0        Use the calculated Patient Ratio above and the CHD Risk Table to determine the patient's CHD Risk.        ATP III CLASSIFICATION (LDL):  <100     mg/dL   Optimal  811-914  mg/dL   Near or Above                    Optimal  130-159  mg/dL   Borderline  782-956  mg/dL   High  >213     mg/dL   Very High      Wt Readings from Last 3 Encounters:  07/26/14 287 lb 6.4 oz (130.364 kg)  02/22/14 275 lb 4.8 oz (124.875 kg)  08/30/13 283 lb (128.368 kg)      ASSESSMENT AND PLAN:  1. 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 Chronic DOE - most likely secondary to morbid obesity 3. Morbid obesity  4. HTN - borderline controlled  - continue amlodipine  5.  Mobitz I second  degree AV block - asymptomatic - I do not think that his fatigue is due to this.  He is very sedentary.  I will get a 24 Holter to assess  average HR and assess for higher grade AV block    Current medicines are reviewed at length with the patient today.  The patient does not have concerns regarding medicines.  The following changes have been made:  no change  Labs/ tests ordered today include: 24 Hr Holter     Disposition:   FU with me in 6 months   Signed, Quintella ReichertURNER,TRACI R, MD  07/26/2014 11:26 AM    [facility] [address], [place], [place]  [address] Phone: [phone]; Fax: [phone]

## 2014-07-30 ENCOUNTER — Telehealth: Payer: Self-pay

## 2014-07-30 NOTE — Telephone Encounter (Addendum)
A representative from the Holter dept at [facility] called to inform of Holter results. Rep st that patient was in Mobitz Type I for a majority of the time, but there was a short period of third degree HB noted as well. Requested [employer] rep fax a copy of the monitor and fax number given.  Contacted [name] who st he "had no more symptoms than usual" while wearing his monitor.  He st he is always a little SOB but has no other new complaints.  Contacted [name], monitor tech, to see if she can access monitor readings. Holter dept has not sent them to her either.  Reviewed with [name], who recommends following up in the morning with Dr. Mayford Maldonado.

## 2014-07-31 ENCOUNTER — Telehealth: Payer: Self-pay | Admitting: Cardiology

## 2014-07-31 DIAGNOSIS — R9431 Abnormal electrocardiogram [ECG] [EKG]: Secondary | ICD-10-CM

## 2014-07-31 DIAGNOSIS — R011 Cardiac murmur, unspecified: Secondary | ICD-10-CM

## 2014-07-31 NOTE — Telephone Encounter (Signed)
See telephone encounter from today.

## 2014-07-31 NOTE — Telephone Encounter (Signed)
Patient informed of monitor results and verbal understanding expressed.  ECHO ordered for scheduling. Informed PCC and Glynda JaegerMelissa Tatum of ASAP referral for Dr. Ladona Ridgelaylor. Patient agrees with treatment plan.

## 2014-07-31 NOTE — Telephone Encounter (Signed)
Please let patient know that his heart monitor showed evidence of intermittent high grade AV block with mobitz II and CHB with junctional escape beats.  Please order an echo to assess LVF and get him set up to see Dr. Ladona Ridgelaylor in the office ASAP.  He has symptoms of fatigue but denies any dizziness or syncope.

## 2014-07-31 NOTE — Addendum Note (Signed)
Addended by: Gunnar FusiKEMP, Lorilei Horan A on: 07/31/2014 07:17 PM   Modules accepted: Orders

## 2014-08-02 ENCOUNTER — Other Ambulatory Visit (HOSPITAL_COMMUNITY): Payer: Medicare Other

## 2014-08-06 ENCOUNTER — Other Ambulatory Visit (HOSPITAL_COMMUNITY): Payer: Medicare Other | Admitting: Cardiology

## 2014-08-06 ENCOUNTER — Ambulatory Visit (HOSPITAL_COMMUNITY): Payer: Medicare Other | Attending: Cardiovascular Disease | Admitting: Radiology

## 2014-08-06 ENCOUNTER — Other Ambulatory Visit: Payer: Self-pay

## 2014-08-06 DIAGNOSIS — R011 Cardiac murmur, unspecified: Secondary | ICD-10-CM | POA: Diagnosis not present

## 2014-08-06 DIAGNOSIS — R9431 Abnormal electrocardiogram [ECG] [EKG]: Secondary | ICD-10-CM | POA: Insufficient documentation

## 2014-08-06 DIAGNOSIS — R0602 Shortness of breath: Secondary | ICD-10-CM

## 2014-08-06 DIAGNOSIS — R0609 Other forms of dyspnea: Secondary | ICD-10-CM

## 2014-08-06 MED ORDER — PERFLUTREN LIPID MICROSPHERE
6.0000 mL | Freq: Once | INTRAVENOUS | Status: AC
  Administered 2014-08-06: 6 mL via INTRAVENOUS

## 2014-08-06 NOTE — Progress Notes (Signed)
Echocardiogram performed with [facility].  

## 2014-08-07 ENCOUNTER — Encounter: Payer: Self-pay | Admitting: Internal Medicine

## 2014-08-07 ENCOUNTER — Encounter: Payer: Self-pay | Admitting: *Deleted

## 2014-08-07 ENCOUNTER — Ambulatory Visit (INDEPENDENT_AMBULATORY_CARE_PROVIDER_SITE_OTHER): Payer: Medicare Other | Admitting: Internal Medicine

## 2014-08-07 VITALS — BP 142/76 | HR 54 | Ht 72.0 in | Wt 288.2 lb

## 2014-08-07 DIAGNOSIS — R0609 Other forms of dyspnea: Secondary | ICD-10-CM

## 2014-08-07 DIAGNOSIS — I10 Essential (primary) hypertension: Secondary | ICD-10-CM

## 2014-08-07 DIAGNOSIS — I442 Atrioventricular block, complete: Secondary | ICD-10-CM

## 2014-08-07 LAB — CBC WITH DIFFERENTIAL/PLATELET
BASOS ABS: 0.1 10*3/uL (ref 0.0–0.1)
BASOS PCT: 0.8 % (ref 0.0–3.0)
Eosinophils Absolute: 0.3 10*3/uL (ref 0.0–0.7)
Eosinophils Relative: 4.1 % (ref 0.0–5.0)
HEMATOCRIT: 48 % (ref 39.0–52.0)
Hemoglobin: 16.5 g/dL (ref 13.0–17.0)
Lymphocytes Relative: 31 % (ref 12.0–46.0)
Lymphs Abs: 2.6 10*3/uL (ref 0.7–4.0)
MCHC: 34.3 g/dL (ref 30.0–36.0)
MCV: 95.5 fl (ref 78.0–100.0)
MONO ABS: 0.6 10*3/uL (ref 0.1–1.0)
Monocytes Relative: 7.1 % (ref 3.0–12.0)
NEUTROS ABS: 4.7 10*3/uL (ref 1.4–7.7)
Neutrophils Relative %: 57 % (ref 43.0–77.0)
Platelets: 149 10*3/uL — ABNORMAL LOW (ref 150.0–400.0)
RBC: 5.03 Mil/uL (ref 4.22–5.81)
RDW: 14.4 % (ref 11.5–15.5)
WBC: 8.2 10*3/uL (ref 4.0–10.5)

## 2014-08-07 LAB — BASIC METABOLIC PANEL
BUN: 25 mg/dL — AB (ref 6–23)
CO2: 35 mEq/L — ABNORMAL HIGH (ref 19–32)
Calcium: 9.3 mg/dL (ref 8.4–10.5)
Chloride: 95 mEq/L — ABNORMAL LOW (ref 96–112)
Creatinine, Ser: 1.77 mg/dL — ABNORMAL HIGH (ref 0.40–1.50)
GFR: 40.55 mL/min — ABNORMAL LOW (ref >60.00)
GLUCOSE: 212 mg/dL — AB (ref 70–99)
Potassium: 4.2 mEq/L (ref 3.5–5.1)
Sodium: 134 mEq/L — ABNORMAL LOW (ref 135–145)

## 2014-08-07 NOTE — Assessment & Plan Note (Signed)
I've discussed the treatment options with the patient. The risk, benefits, goals, and expectations of pacemaker insertion have been reviewed and he wishes to proceed.

## 2014-08-07 NOTE — Progress Notes (Signed)
    HPI [name] is referred today by Dr. Turner for evaluation of complete heart block. The patient is a very pleasant 68-year-old man with a history of hypertension, diabetes, peripheral vascular disease, and generalized fatigue. He has subsequently been found to have complete heart block on cardiac monitoring. He has preserved left ventricular function by echo. He has never had syncope. Allergies  Allergen Reactions  . Penicillins     REACTION: swollen tongue and eyes     Current Outpatient Prescriptions  Medication Sig Dispense Refill  . amLODipine (NORVASC) 5 MG tablet Take 5 mg by mouth daily.     . aspirin EC 81 MG tablet Take 81 mg by mouth daily.    . B-D INS SYR ULTRAFINE 1CC/30G 30G X 1/2" 1 ML MISC   12  . FLUoxetine (PROZAC) 10 MG capsule Take 10 mg by mouth daily.     . insulin NPH Human (HUMULIN N,NOVOLIN N) 100 UNIT/ML injection Inject into the skin 2 (two) times daily before a meal. 10/15 units sub q bid.    . insulin NPH Human (HUMULIN N,NOVOLIN N) 100 UNIT/ML injection Inject into the skin 2 (two) times daily before a meal. 20/30 units sub q bid    . levothyroxine (SYNTHROID, LEVOTHROID) 200 MCG tablet Take 200 mcg by mouth daily.  7  . Multiple Vitamin (MULITIVITAMIN WITH MINERALS) TABS Take 1 tablet by mouth daily.    . nitroGLYCERIN (NITROSTAT) 0.4 MG SL tablet Place 0.4 mg under the tongue every 5 (five) minutes as needed for chest pain.    . Olmesartan Medoxomil (BENICAR PO) Take 10 mg by mouth daily.    . ONE TOUCH ULTRA TEST test strip   12  . pantoprazole (PROTONIX) 40 MG tablet Take 40 mg by mouth daily.     No current facility-administered medications for this visit.     Past Medical History  Diagnosis Date  . Neck abscess   . Diabetes mellitus   . Hypertension   . MRSA (methicillin resistant Staphylococcus aureus)   . Coronary artery disease cath 2006    nonobstructive ASCAD with 30% RCA  . Shortness of breath   . Sleep apnea     cpap  .  Hypothyroidism   . Depression   . GERD (gastroesophageal reflux disease)   . Anxiety     ROS:   All systems reviewed and negative except as noted in the HPI.   Past Surgical History  Procedure Laterality Date  . Back surgery    . Abscess drainage    . Amputation Right 02/22/2014    Procedure: AMPUTATION RAY RIGHT FOURTH AND FIFTH;  Surgeon: John Hewitt, MD;  Location: [facility] OR;  Service: Orthopedics;  Laterality: Right;     Family History  Problem Relation Age of Onset  . Heart disease Mother   . Heart disease Father      History   Social History  . Marital Status: Married    Spouse Name: N/A  . Number of Children: N/A  . Years of Education: N/A   Occupational History  . retired    Social History Main Topics  . Smoking status: Former Smoker -- 0.50 packs/day for 10 years    Types: Cigarettes    Quit date: 06/15/1973  . Smokeless tobacco: Never Used  . Alcohol Use: No  . Drug Use: No  . Sexual Activity: Not on file   Other Topics Concern  . Not on file   Social History   Narrative     BP 142/76 mmHg  Pulse 54  Ht 6' (1.829 m)  Wt 288 lb 3.2 oz (130.727 kg)  BMI 39.08 kg/m2  Physical Exam:  Well appearing 68-year-old man, NAD HEENT: Unremarkable Neck:  6 cm JVD, no thyromegally Back:  No CVA tenderness Lungs:  Clear except for scattered basilar rales bilaterally. No increased work of breathing. HEART:  IRegular brady rhythm, no murmurs, no rubs, no clicks Abd:  soft, positive bowel sounds, no organomegally, no rebound, no guarding Ext:  2 plus pulses, trace peripheral edema, no cyanosis, no clubbing Skin:  No rashes no nodules Neuro:  CN II through XII intact, motor grossly intact  DEVICE  Normal device function.  See PaceArt for details.   Assess/Plan: 

## 2014-08-07 NOTE — Patient Instructions (Signed)
Your physician recommends that you schedule a follow-up appointment in: 7-10 days from 08/10/14 in device clinic for wound check  Your physician has recommended that you have a pacemaker inserted. A pacemaker is a small device that is placed under the skin of your chest or abdomen to help control abnormal heart rhythms. This device uses electrical pulses to prompt the heart to beat at a normal rate. Pacemakers are used to treat heart rhythms that are too slow. Wire (leads) are attached to the pacemaker that goes into the chambers of you heart. This is done in the hospital and usually requires and overnight stay. Please see the instruction sheet given to you today for more information.  Pacemaker Implantation The heart has its own electrical system, or natural pacemaker, to regulate the heartbeat. Sometimes, the natural pacemaker system of the heart fails and causes the heart to beat too slowly. If this happens, a pacemaker can be surgically placed to help the heart beat at a normal or programmed rate. A pacemaker is a small, battery-powered device that is placed under the skin and is programmed to sense your heartbeats. If your heart rate is lower than the programmed rate, the pacemaker will pace your heart. Parts of a pacemaker include:  Wires or leads. The leads are placed in the heart and transmit electricity to the heart. The leads are connected to the pulse generator.  Pulse generator. The pulse generator contains a computer and a memory system. The pulse generator also produces the electrical signal that triggers the heart to beat. A pacemaker may be placed if:  You have a slow heartbeat (bradycardia).  You have fainting (syncope).  Shortness of breath (dyspnea) due to heart problems. LET [facility] CARE PROVIDER KNOW ABOUT:  Any allergies you may have.  All medicines you are taking, including vitamins, herbs, eye drops, creams, and over-the-counter medicines.  Previous problems you or  members of your family have had with the use of anesthetics.  Any blood disorders you have.  Previous surgeries you have had.  Medical conditions you have.  Possibility of pregnancy, if this applies. RISKS AND COMPLICATIONS Generally, pacemaker implantation is a safe procedure. However, problems can occur and include:  Bleeding.  Unable to place the pacemaker under local sedation.  Infection. BEFORE THE PROCEDURE  You will have blood work drawn before the procedure.  Do not use any tobacco products including cigarettes, chewing tobacco, or electronic cigarettes. If you need help quitting, ask your health care provider.  Do not eat or drink anything after midnight on the night before the procedure or as directed by your health care provider.  Ask your health care provider about:  Changing or stopping your regular medicines. This is especially important if you are taking diabetes medicines or blood thinners.  Taking medicines such as aspirin and ibuprofen. These medicines can thin your blood. Do not take these medicines before your procedure if your health care provider asks you not to.  Ask your health care provider if you can take a sip of water with any approved medicines the morning of the procedure. PROCEDURE  The surgery to place a pacemaker is considered a minimally invasive surgical procedure. It is done under a local anesthetic, which is an injection at the incision site that makes the skin numb. You are also given sedation and pain medicine that makes you drowsy during the procedure.   An intravenous line (IV) will be started in your hand or arm so sedation and pain  medicine can be given during the pacemaker procedure.  A numbing medicine will be injected into the skin where the pacemaker is to be placed. A small incision will then be made into the skin. The pacemaker is usually placed under the skin near the collarbone.  After the incision has been made, the leads will  be inserted into a large vein and guided into the heart using X-ray.  Using the same incision that was used to place the leads, a small pocket will be created under the skin to hold the pulse generator. The leads will then be connected to the pulse generator.  The incision site will then be closed. A bandage (dressing) is placed over the pacemaker site. The dressing is removed 24-48 hours afterward. AFTER THE PROCEDURE  You will be taken to a recovery area after the pacemaker implant. Your vital signs such as blood pressure, heart rate, breathing, and oxygen levels will be monitored.  A chest X-ray will be done after the pacemaker has been implanted. This is to make sure the pacemaker and leads are in the correct place. Document Released: 05/22/2002 Document Revised: 10/16/2013 Document Reviewed: 10/06/2011 [facility] Patient Information 2015 [place], [place]. This information is not intended to replace advice given to you by your health care provider. Make sure you discuss any questions you have with your health care provider.

## 2014-08-07 NOTE — Assessment & Plan Note (Signed)
His blood pressure is elevated slightly. He will continue his current medication. He is encouraged to reduce his salt intake.

## 2014-08-07 NOTE — Assessment & Plan Note (Signed)
This is a multifactorial problem but certainly in part related to his complete heart block. For his dyspnea will improve with increasing heart rate.

## 2014-08-09 DIAGNOSIS — I2510 Atherosclerotic heart disease of native coronary artery without angina pectoris: Secondary | ICD-10-CM | POA: Diagnosis not present

## 2014-08-09 DIAGNOSIS — I10 Essential (primary) hypertension: Secondary | ICD-10-CM | POA: Diagnosis not present

## 2014-08-09 DIAGNOSIS — Z794 Long term (current) use of insulin: Secondary | ICD-10-CM | POA: Diagnosis not present

## 2014-08-09 DIAGNOSIS — Z7982 Long term (current) use of aspirin: Secondary | ICD-10-CM | POA: Diagnosis not present

## 2014-08-09 DIAGNOSIS — I129 Hypertensive chronic kidney disease with stage 1 through stage 4 chronic kidney disease, or unspecified chronic kidney disease: Secondary | ICD-10-CM | POA: Diagnosis not present

## 2014-08-09 DIAGNOSIS — E039 Hypothyroidism, unspecified: Secondary | ICD-10-CM | POA: Diagnosis not present

## 2014-08-09 DIAGNOSIS — Z6838 Body mass index (BMI) 38.0-38.9, adult: Secondary | ICD-10-CM | POA: Diagnosis not present

## 2014-08-09 DIAGNOSIS — G4730 Sleep apnea, unspecified: Secondary | ICD-10-CM | POA: Diagnosis not present

## 2014-08-09 DIAGNOSIS — I739 Peripheral vascular disease, unspecified: Secondary | ICD-10-CM | POA: Diagnosis not present

## 2014-08-09 DIAGNOSIS — I442 Atrioventricular block, complete: Secondary | ICD-10-CM | POA: Diagnosis not present

## 2014-08-09 DIAGNOSIS — Z87891 Personal history of nicotine dependence: Secondary | ICD-10-CM | POA: Diagnosis not present

## 2014-08-09 DIAGNOSIS — E1121 Type 2 diabetes mellitus with diabetic nephropathy: Secondary | ICD-10-CM | POA: Diagnosis not present

## 2014-08-09 DIAGNOSIS — E119 Type 2 diabetes mellitus without complications: Secondary | ICD-10-CM | POA: Diagnosis not present

## 2014-08-09 DIAGNOSIS — N183 Chronic kidney disease, stage 3 (moderate): Secondary | ICD-10-CM | POA: Diagnosis not present

## 2014-08-09 DIAGNOSIS — K219 Gastro-esophageal reflux disease without esophagitis: Secondary | ICD-10-CM | POA: Diagnosis not present

## 2014-08-09 MED ORDER — MUPIROCIN 2 % EX OINT
1.0000 "application " | TOPICAL_OINTMENT | Freq: Once | CUTANEOUS | Status: AC
  Administered 2014-08-10: 1 via TOPICAL
  Filled 2014-08-09: qty 22

## 2014-08-09 MED ORDER — SODIUM CHLORIDE 0.9 % IR SOLN
80.0000 mg | Status: DC
  Filled 2014-08-09: qty 2

## 2014-08-09 MED ORDER — SODIUM CHLORIDE 0.9 % IV SOLN
1500.0000 mg | INTRAVENOUS | Status: DC
  Filled 2014-08-09: qty 1500

## 2014-08-09 MED ORDER — SODIUM CHLORIDE 0.9 % IV SOLN
INTRAVENOUS | Status: DC
  Administered 2014-08-10: 07:00:00 via INTRAVENOUS

## 2014-08-09 MED ORDER — CHLORHEXIDINE GLUCONATE 4 % EX LIQD
60.0000 mL | Freq: Once | CUTANEOUS | Status: DC
  Filled 2014-08-09: qty 60

## 2014-08-10 ENCOUNTER — Encounter (HOSPITAL_COMMUNITY): Payer: Self-pay | Admitting: *Deleted

## 2014-08-10 ENCOUNTER — Ambulatory Visit (HOSPITAL_COMMUNITY)
Admission: RE | Admit: 2014-08-10 | Discharge: 2014-08-11 | Disposition: A | Discharge status: Home / Self Care | Payer: Medicare Other | Source: Ambulatory Visit | Attending: Internal Medicine | Admitting: Internal Medicine

## 2014-08-10 ENCOUNTER — Encounter (HOSPITAL_COMMUNITY)
Admission: RE | Disposition: A | Discharge status: Home / Self Care | Payer: Self-pay | Source: Ambulatory Visit | Attending: Internal Medicine

## 2014-08-10 DIAGNOSIS — N183 Chronic kidney disease, stage 3 unspecified: Secondary | ICD-10-CM | POA: Diagnosis present

## 2014-08-10 DIAGNOSIS — E039 Hypothyroidism, unspecified: Secondary | ICD-10-CM | POA: Insufficient documentation

## 2014-08-10 DIAGNOSIS — I2510 Atherosclerotic heart disease of native coronary artery without angina pectoris: Secondary | ICD-10-CM | POA: Diagnosis present

## 2014-08-10 DIAGNOSIS — E119 Type 2 diabetes mellitus without complications: Secondary | ICD-10-CM | POA: Insufficient documentation

## 2014-08-10 DIAGNOSIS — E1122 Type 2 diabetes mellitus with diabetic chronic kidney disease: Secondary | ICD-10-CM | POA: Diagnosis present

## 2014-08-10 DIAGNOSIS — I442 Atrioventricular block, complete: Secondary | ICD-10-CM | POA: Diagnosis not present

## 2014-08-10 DIAGNOSIS — E1121 Type 2 diabetes mellitus with diabetic nephropathy: Secondary | ICD-10-CM | POA: Diagnosis present

## 2014-08-10 DIAGNOSIS — Z6838 Body mass index (BMI) 38.0-38.9, adult: Secondary | ICD-10-CM | POA: Insufficient documentation

## 2014-08-10 DIAGNOSIS — K219 Gastro-esophageal reflux disease without esophagitis: Secondary | ICD-10-CM | POA: Insufficient documentation

## 2014-08-10 DIAGNOSIS — Z794 Long term (current) use of insulin: Secondary | ICD-10-CM | POA: Insufficient documentation

## 2014-08-10 DIAGNOSIS — Z87891 Personal history of nicotine dependence: Secondary | ICD-10-CM | POA: Insufficient documentation

## 2014-08-10 DIAGNOSIS — I129 Hypertensive chronic kidney disease with stage 1 through stage 4 chronic kidney disease, or unspecified chronic kidney disease: Secondary | ICD-10-CM | POA: Insufficient documentation

## 2014-08-10 DIAGNOSIS — I10 Essential (primary) hypertension: Secondary | ICD-10-CM | POA: Diagnosis present

## 2014-08-10 DIAGNOSIS — Z950 Presence of cardiac pacemaker: Secondary | ICD-10-CM | POA: Diagnosis not present

## 2014-08-10 DIAGNOSIS — Z7982 Long term (current) use of aspirin: Secondary | ICD-10-CM | POA: Insufficient documentation

## 2014-08-10 DIAGNOSIS — G4730 Sleep apnea, unspecified: Secondary | ICD-10-CM | POA: Diagnosis present

## 2014-08-10 DIAGNOSIS — I739 Peripheral vascular disease, unspecified: Secondary | ICD-10-CM | POA: Insufficient documentation

## 2014-08-10 HISTORY — PX: PERMANENT PACEMAKER INSERTION: SHX5480

## 2014-08-10 LAB — GLUCOSE, CAPILLARY
GLUCOSE-CAPILLARY: 249 mg/dL — AB (ref 70–99)
Glucose-Capillary: 86 mg/dL (ref 70–99)
Glucose-Capillary: 154 mg/dL — ABNORMAL HIGH (ref 70–99)
Glucose-Capillary: 188 mg/dL — ABNORMAL HIGH (ref 70–99)

## 2014-08-10 LAB — SURGICAL PCR SCREEN
MRSA, PCR: NEGATIVE
STAPHYLOCOCCUS AUREUS: NEGATIVE

## 2014-08-10 SURGERY — PERMANENT PACEMAKER INSERTION

## 2014-08-10 MED ORDER — INSULIN NPH (HUMAN) (ISOPHANE) 100 UNIT/ML ~~LOC~~ SUSP: 20.0000 [IU] | Freq: Two times a day (BID) | SUBCUTANEOUS | Status: DC

## 2014-08-10 MED ORDER — AMLODIPINE BESYLATE 5 MG PO TABS
5.0000 mg | ORAL_TABLET | Freq: Every day | ORAL | Status: DC
  Administered 2014-08-10 – 2014-08-11 (×2): 5 mg via ORAL
  Filled 2014-08-10 (×2): qty 1

## 2014-08-10 MED ORDER — INSULIN ASPART 100 UNIT/ML ~~LOC~~ SOLN
0.0000 [IU] | Freq: Three times a day (TID) | SUBCUTANEOUS | Status: DC
  Administered 2014-08-10: 3 [IU] via SUBCUTANEOUS
  Administered 2014-08-11: 2 [IU] via SUBCUTANEOUS

## 2014-08-10 MED ORDER — ADULT MULTIVITAMIN W/MINERALS CH
1.0000 | ORAL_TABLET | Freq: Every day | ORAL | Status: DC
  Administered 2014-08-10 – 2014-08-11 (×2): 1 via ORAL
  Filled 2014-08-10 (×2): qty 1

## 2014-08-10 MED ORDER — SODIUM CHLORIDE 0.9 % IV SOLN
INTRAVENOUS | Status: DC
  Administered 2014-08-10: 07:00:00 via INTRAVENOUS

## 2014-08-10 MED ORDER — ACETAMINOPHEN 325 MG PO TABS: 325.0000 mg | ORAL_TABLET | ORAL | Status: DC | PRN

## 2014-08-10 MED ORDER — LEVOTHYROXINE SODIUM 200 MCG PO TABS
200.0000 ug | ORAL_TABLET | Freq: Every day | ORAL | Status: DC
  Administered 2014-08-10 – 2014-08-11 (×2): 200 ug via ORAL
  Filled 2014-08-10 (×3): qty 1

## 2014-08-10 MED ORDER — MIDAZOLAM HCL 5 MG/5ML IJ SOLN
INTRAMUSCULAR | Status: AC
  Filled 2014-08-10: qty 5

## 2014-08-10 MED ORDER — PANTOPRAZOLE SODIUM 40 MG PO TBEC
40.0000 mg | DELAYED_RELEASE_TABLET | Freq: Every day | ORAL | Status: DC
  Administered 2014-08-10 – 2014-08-11 (×2): 40 mg via ORAL

## 2014-08-10 MED ORDER — NITROGLYCERIN 0.4 MG SL SUBL: 0.4000 mg | SUBLINGUAL_TABLET | SUBLINGUAL | Status: DC | PRN

## 2014-08-10 MED ORDER — HEPARIN (PORCINE) IN NACL 2-0.9 UNIT/ML-% IJ SOLN
INTRAMUSCULAR | Status: AC
  Filled 2014-08-10: qty 1000

## 2014-08-10 MED ORDER — ZOLPIDEM TARTRATE 5 MG PO TABS
5.0000 mg | ORAL_TABLET | Freq: Every day | ORAL | Status: DC
  Filled 2014-08-10: qty 1

## 2014-08-10 MED ORDER — ZOLPIDEM TARTRATE 5 MG PO TABS
10.0000 mg | ORAL_TABLET | Freq: Every day | ORAL | Status: DC
  Administered 2014-08-10: 10 mg via ORAL

## 2014-08-10 MED ORDER — LIDOCAINE HCL (PF) 1 % IJ SOLN
INTRAMUSCULAR | Status: AC
  Filled 2014-08-10: qty 60

## 2014-08-10 MED ORDER — FENTANYL CITRATE 0.05 MG/ML IJ SOLN
INTRAMUSCULAR | Status: AC
  Filled 2014-08-10: qty 2

## 2014-08-10 MED ORDER — FLUOXETINE HCL 10 MG PO CAPS
10.0000 mg | ORAL_CAPSULE | Freq: Every day | ORAL | Status: DC
  Administered 2014-08-10 – 2014-08-11 (×2): 10 mg via ORAL
  Filled 2014-08-10 (×2): qty 1

## 2014-08-10 MED ORDER — VANCOMYCIN HCL IN DEXTROSE 1-5 GM/200ML-% IV SOLN
1000.0000 mg | Freq: Two times a day (BID) | INTRAVENOUS | Status: AC
  Administered 2014-08-10: 1000 mg via INTRAVENOUS
  Filled 2014-08-10: qty 200

## 2014-08-10 MED ORDER — ASPIRIN EC 81 MG PO TBEC
81.0000 mg | DELAYED_RELEASE_TABLET | Freq: Every day | ORAL | Status: DC
  Administered 2014-08-10 – 2014-08-11 (×2): 81 mg via ORAL
  Filled 2014-08-10 (×2): qty 1

## 2014-08-10 MED ORDER — MUPIROCIN 2 % EX OINT
TOPICAL_OINTMENT | CUTANEOUS | Status: AC
Start: 2014-08-10 — End: 2014-08-10
  Administered 2014-08-10: 1 via TOPICAL
  Filled 2014-08-10: qty 22

## 2014-08-10 MED ORDER — ONDANSETRON HCL 4 MG/2ML IJ SOLN: 4.0000 mg | Freq: Four times a day (QID) | INTRAMUSCULAR | Status: DC | PRN

## 2014-08-10 NOTE — Progress Notes (Signed)
Pt pick up @  3:51 PM    [name],[name]

## 2014-08-10 NOTE — H&P (View-Only) (Signed)
HPI [name] is referred today by Dr. Mayford Knife for evaluation of complete heart block. The patient is a very pleasant 69 year old man with a history of hypertension, diabetes, peripheral vascular disease, and generalized fatigue. He has subsequently been found to have complete heart block on cardiac monitoring. He has preserved left ventricular function by echo. He has never had syncope. Allergies  Allergen Reactions  . Penicillins     REACTION: swollen tongue and eyes     Current Outpatient Prescriptions  Medication Sig Dispense Refill  . amLODipine (NORVASC) 5 MG tablet Take 5 mg by mouth daily.     Marland Kitchen aspirin EC 81 MG tablet Take 81 mg by mouth daily.    . B-D INS SYR ULTRAFINE 1CC/30G 30G X 1/2" 1 ML MISC   12  . FLUoxetine (PROZAC) 10 MG capsule Take 10 mg by mouth daily.     . insulin NPH Human (HUMULIN N,NOVOLIN N) 100 UNIT/ML injection Inject into the skin 2 (two) times daily before a meal. 10/15 units sub q bid.    . insulin NPH Human (HUMULIN N,NOVOLIN N) 100 UNIT/ML injection Inject into the skin 2 (two) times daily before a meal. 20/30 units sub q bid    . levothyroxine (SYNTHROID, LEVOTHROID) 200 MCG tablet Take 200 mcg by mouth daily.  7  . Multiple Vitamin (MULITIVITAMIN WITH MINERALS) TABS Take 1 tablet by mouth daily.    . nitroGLYCERIN (NITROSTAT) 0.4 MG SL tablet Place 0.4 mg under the tongue every 5 (five) minutes as needed for chest pain.    . Olmesartan Medoxomil (BENICAR PO) Take 10 mg by mouth daily.    . ONE TOUCH ULTRA TEST test strip   12  . pantoprazole (PROTONIX) 40 MG tablet Take 40 mg by mouth daily.     No current facility-administered medications for this visit.     Past Medical History  Diagnosis Date  . Neck abscess   . Diabetes mellitus   . Hypertension   . MRSA (methicillin resistant Staphylococcus aureus)   . Coronary artery disease cath 2006    nonobstructive ASCAD with 30% RCA  . Shortness of breath   . Sleep apnea     cpap  .  Hypothyroidism   . Depression   . GERD (gastroesophageal reflux disease)   . Anxiety     ROS:   All systems reviewed and negative except as noted in the HPI.   Past Surgical History  Procedure Laterality Date  . Back surgery    . Abscess drainage    . Amputation Right 02/22/2014    Procedure: AMPUTATION RAY RIGHT FOURTH AND FIFTH;  Surgeon: Toni Arthurs, MD;  Location: [facility] OR;  Service: Orthopedics;  Laterality: Right;     Family History  Problem Relation Age of Onset  . Heart disease Mother   . Heart disease Father      History   Social History  . Marital Status: Married    Spouse Name: N/A  . Number of Children: N/A  . Years of Education: N/A   Occupational History  . retired    Social History Main Topics  . Smoking status: Former Smoker -- 0.50 packs/day for 10 years    Types: Cigarettes    Quit date: 06/15/1973  . Smokeless tobacco: Never Used  . Alcohol Use: No  . Drug Use: No  . Sexual Activity: Not on file   Other Topics Concern  . Not on file   Social History  Narrative     BP 142/76 mmHg  Pulse 54  Ht 6' (1.829 m)  Wt 288 lb 3.2 oz (130.727 kg)  BMI 39.08 kg/m2  Physical Exam:  Well appearing 69 year old man, NAD HEENT: Unremarkable Neck:  6 cm JVD, no thyromegally Back:  No CVA tenderness Lungs:  Clear except for scattered basilar rales bilaterally. No increased work of breathing. HEART:  IRegular brady rhythm, no murmurs, no rubs, no clicks Abd:  soft, positive bowel sounds, no organomegally, no rebound, no guarding Ext:  2 plus pulses, trace peripheral edema, no cyanosis, no clubbing Skin:  No rashes no nodules Neuro:  CN II through XII intact, motor grossly intact  DEVICE  Normal device function.  See PaceArt for details.   Assess/Plan:

## 2014-08-10 NOTE — CV Procedure (Signed)
SURGEON:  Lewayne BuntingGregg Taylor, MD     PREPROCEDURE DIAGNOSIS:  Symptomatic Bradycardia due to complete heart block    POSTPROCEDURE DIAGNOSIS:  Same as preprocedure diagnosis     PROCEDURES:   1. Pacemaker implantation.     INTRODUCTION: [name] is a 69 y.o. male  with a history of bradycardia due to complete heart block who presents today for pacemaker implantation.  The patient reports intermittent episodes of dizziness over the past few months.  No reversible causes have been identified.  The patient therefore presents today for pacemaker implantation.     DESCRIPTION OF PROCEDURE:  Informed written consent was obtained, and   the patient was brought to the electrophysiology lab in a fasting state.  The patient required no sedation for the procedure today.  The patients left chest was prepped and draped in the usual sterile fashion by the EP lab staff. The skin overlying the left deltopectoral region was infiltrated with lidocaine for local analgesia.  A 4-cm incision was made over the left deltopectoral region.  A left subcutaneous pacemaker pocket was fashioned using a combination of sharp and blunt dissection. Electrocautery was required to assure hemostasis.      RA/RV Lead Placement: The left cephalic vein was therefore directly visualized and cannulated.  Through the left cephalic vein, a St. Jude [id number] (serial number [id number]) right atrial lead and a St. Jude [id number] (serial number [id number]) right ventricular lead were advanced with fluoroscopic visualization into the right atrial appendage and right ventricular apical septal positions respectively.  Initial atrial lead P- waves measured 2 mV with impedance of 436 ohms and a threshold of 0.4 V at 0.5 msec.  Right ventricular lead R-waves measured 7 mV with an impedance of 637 ohms and a threshold of 0.5 V at 0.5 msec.  Both leads were secured to the pectoralis fascia using #2-0 silk over the suture sleeves.   Device Placement:  The leads  were then connected to a St.Jude (serial number [id number]) pacemaker.  The pocket was irrigated with copious gentamicin solution.  The pacemaker was then placed into the pocket.  The pocket was then closed in 2 layers with 2.0 Vicryl suture for the subcutaneous and subcuticular layers.  Steri-Strips and a sterile dressing were then applied.  There were no early apparent complications.     CONCLUSIONS:   1. Successful implantation of a St. Jude dual-chamber pacemaker for symptomatic bradycardia due to complete heart block  2. No early apparent complications.           Lewayne BuntingGregg Taylor, MD 08/10/2014 8:46 AM

## 2014-08-10 NOTE — Interval H&P Note (Signed)
History and Physical Interval Note:  08/10/2014 7:25 AM  [name]  has presented today for surgery, with the diagnosis of 3rd degree hb  The various methods of treatment have been discussed with the patient and family. After consideration of risks, benefits and other options for treatment, the patient has consented to  Procedure(s): PERMANENT PACEMAKER INSERTION (N/A) as a surgical intervention .  The patient's history has been reviewed, patient examined, no change in status, stable for surgery.  I have reviewed the patient's chart and labs.  Questions were answered to the patient's satisfaction.     Leonia ReevesGregg Eunice Winecoff,M.D.

## 2014-08-11 ENCOUNTER — Ambulatory Visit (HOSPITAL_COMMUNITY): Payer: Medicare Other

## 2014-08-11 DIAGNOSIS — N183 Chronic kidney disease, stage 3 unspecified: Secondary | ICD-10-CM | POA: Diagnosis present

## 2014-08-11 DIAGNOSIS — E1121 Type 2 diabetes mellitus with diabetic nephropathy: Secondary | ICD-10-CM | POA: Diagnosis present

## 2014-08-11 DIAGNOSIS — I442 Atrioventricular block, complete: Secondary | ICD-10-CM | POA: Diagnosis not present

## 2014-08-11 DIAGNOSIS — I739 Peripheral vascular disease, unspecified: Secondary | ICD-10-CM | POA: Diagnosis not present

## 2014-08-11 DIAGNOSIS — G4730 Sleep apnea, unspecified: Secondary | ICD-10-CM | POA: Diagnosis present

## 2014-08-11 DIAGNOSIS — I10 Essential (primary) hypertension: Secondary | ICD-10-CM | POA: Diagnosis not present

## 2014-08-11 DIAGNOSIS — E1122 Type 2 diabetes mellitus with diabetic chronic kidney disease: Secondary | ICD-10-CM | POA: Diagnosis present

## 2014-08-11 DIAGNOSIS — E119 Type 2 diabetes mellitus without complications: Secondary | ICD-10-CM | POA: Diagnosis not present

## 2014-08-11 DIAGNOSIS — Z950 Presence of cardiac pacemaker: Secondary | ICD-10-CM | POA: Diagnosis not present

## 2014-08-11 LAB — GLUCOSE, CAPILLARY: Glucose-Capillary: 170 mg/dL — ABNORMAL HIGH (ref 70–99)

## 2014-08-11 MED ORDER — ACETAMINOPHEN 325 MG PO TABS: 325.0000 mg | ORAL_TABLET | ORAL | Status: DC | PRN

## 2014-08-11 NOTE — Discharge Summary (Signed)
Patient ID: [name],  MRN: [id number], DOB/AGE: 09/21/1945 69 y.o.  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 is a 69 y/o male referred to Dr Ladona Ridgel by Dr. Mayford Knife for evaluation of complete heart block. The patient has a history of hypertension, diabetes, peripheral vascular disease, and generalized fatigue. He has subsequently been found to have complete heart block on cardiac monitoring. He has preserved left ventricular function by echo. He has never had syncope. He was admitted for elective pacemaker implant 08/10/14. This was done by Dr Johney Frame. The pt received a St Jude device without complications 08/10/14. Dr Johney Frame saw her the morning of the 27 th and felt she was stable for discharge.   Discharge Vitals:  Blood pressure 149/93, pulse 85, temperature 97.6 F (36.4 C), temperature source Oral, resp. rate 18, height 6' (1.829 m), weight 285 lb (129.275 kg), SpO2 93 %.    Labs: Results for orders placed or performed during the hospital encounter of 08/10/14 (from the past 24 hour(s))  Glucose, capillary     Status: Abnormal   Collection Time: 08/10/14  1:22 PM  Result Value Ref Range   Glucose-Capillary 154 (H) 70 - 99 mg/dL  Glucose, capillary     Status: Abnormal   Collection Time: 08/10/14  4:42 PM  Result Value Ref Range   Glucose-Capillary 249 (H) 70 - 99 mg/dL   Comment 1 Documented in Char    Comment 2 Repeat Test   Glucose, capillary     Status: Abnormal     Collection Time: 08/10/14  8:43 PM  Result Value Ref Range   Glucose-Capillary 188 (H) 70 - 99 mg/dL   Comment 1 Documented in Char    Comment 2 Repeat Test   Glucose, capillary     Status: Abnormal   Collection Time: 08/11/14  6:09 AM  Result Value Ref Range   Glucose-Capillary 170 (H) 70 - 99 mg/dL    Disposition:      Follow-up Information    Follow up with Hillis Range, MD On 08/22/2014.   Specialty:  Cardiology   Why:  2:30 for pacer site check   Contact information:   [address] [place] [address] [phone]       Discharge Medications:    Medication List    TAKE these medications        acetaminophen 325 MG tablet  Commonly known as:  TYLENOL  Take 1-2 tablets (325-650 mg total) by mouth every 4 (four) hours as needed for mild pain.     amLODipine 5 MG tablet  Commonly known as:  NORVASC  Take 5 mg by mouth daily.     aspirin EC 81 MG tablet  Take 81 mg by mouth daily.     B-D INS SYR ULTRAFINE 1CC/30G 30G X 1/2" 1 ML Misc  Generic drug:  Insulin Syringe-Needle U-100     BENICAR PO  Take 10 mg by mouth daily.  FLUoxetine 10 MG capsule  Commonly known as:  PROZAC  Take 10 mg by mouth daily.     insulin NPH Human 100 UNIT/ML injection  Commonly known as:  HUMULIN N,NOVOLIN N  Inject 20-30 Units into the skin 2 (two) times daily before a meal.     insulin regular 100 units/mL injection  Commonly known as:  NOVOLIN R,HUMULIN R  Inject 10-15 Units into the skin 2 (two) times daily before a meal.     levothyroxine 200 MCG tablet  Commonly known as:  SYNTHROID, LEVOTHROID  Take 200 mcg by mouth daily.     multivitamin with minerals Tabs tablet  Take 1 tablet by mouth daily.     nitroGLYCERIN 0.4 MG SL tablet  Commonly known as:  NITROSTAT  Place 0.4 mg under the tongue every 5 (five) minutes as needed for chest pain.     ONE TOUCH ULTRA TEST test strip  Generic drug:  glucose blood     pantoprazole 40 MG tablet   Commonly known as:  PROTONIX  Take 40 mg by mouth daily.         Duration of Discharge Encounter: Greater than 30 minutes including physician time.  Jolene ProvostSigned, Luke Kilroy PA-C 08/11/2014 10:05 AM    Hillis RangeJames Virgal Warmuth MD

## 2014-08-11 NOTE — Discharge Instructions (Signed)

## 2014-08-11 NOTE — Progress Notes (Signed)
   SUBJECTIVE: The patient is doing well today.  SOB is resolved with pacing.  At this time, he denies chest pain r any new concerns.  Marland Kitchen. amLODipine  5 mg Oral Daily  . aspirin EC  81 mg Oral Daily  . FLUoxetine  10 mg Oral Daily  . insulin aspart  0-9 Units Subcutaneous TID WC  . levothyroxine  200 mcg Oral QAC breakfast  . multivitamin with minerals  1 tablet Oral Daily  . pantoprazole  40 mg Oral Daily  . zolpidem  5 mg Oral QHS      OBJECTIVE: Physical Exam: Filed Vitals:   08/10/14 0556 08/10/14 0915 08/10/14 1607 08/10/14 2008  BP: 159/62 151/76 150/67 149/93  Pulse: 138 59 59 85  Temp: 98.3 F (36.8 C) 97.5 F (36.4 C) 98.2 F (36.8 C) 97.6 F (36.4 C)  TempSrc: Oral Oral Oral Oral  Resp: 20  18 18   Height: 6' (1.829 m)     Weight: 285 lb (129.275 kg)     SpO2: 95% 95% 94% 93%    Intake/Output Summary (Last 24 hours) at 08/11/14 0757 Last data filed at 08/10/14 1900  Gross per 24 hour  Intake    600 ml  Output    600 ml  Net      0 ml    Telemetry reveals sinus rhythm with V pacing  GEN- The patient is well appearing, alert and oriented x 3 today.   Head- normocephalic, atraumatic Eyes-  Sclera clear, conjunctiva pink Ears- hearing intact Oropharynx- clear Neck- supple  Lungs- Clear to ausculation bilaterally, normal work of breathing Heart- Regular rate and rhythm  GI- soft, NT, ND, + BS Extremities- no clubbing, cyanosis, or edema Skin- pacemaker pocket is without hematoma  RADIOLOGY: Dg Chest 2 View  08/11/2014   CLINICAL DATA:  Status post pacemaker placement  EXAM: CHEST  2 VIEW  COMPARISON:  02/12/2014  FINDINGS: A new pacing device is noted on the left. No pneumothorax is noted. Some scarring in the right medial lung base is again seen. The cardiac shadow is within normal limits.  IMPRESSION: No pneumothorax following pacemaker placement  Stable scarring in the medial right lung base.   Electronically Signed   By: Alcide CleverMark  Lukens M.D.   On:  08/11/2014 07:52    ASSESSMENT AND PLAN:  Active Problems:   Complete heart block  Doing well s/p PPM CXR reveals stable leads, no ptx Device interrogation is reviewed and normal  DC to home today Routine wound care and management Wound check as scheduled Follow-up with Dr Ladona Ridgelaylor in 3 months  Hillis RangeJames Karoline Fleer, MD 08/11/2014 7:57 AM

## 2014-08-11 NOTE — Progress Notes (Signed)
Pt A&O x4; pt discharge education and instructions completed with pt and spouse at bedside. All voices understanding and denies any questions. Pt IV and telemetry removed; pt discharge home with spouse to transport him home. Pt refuses his sling to be put on again. Pt transported off unit via wheelchair with spouse and belongings to the side. P. Amo Edras Wilford Rn.

## 2014-08-18 ENCOUNTER — Other Ambulatory Visit: Payer: Self-pay | Admitting: Internal Medicine

## 2014-08-22 ENCOUNTER — Ambulatory Visit (INDEPENDENT_AMBULATORY_CARE_PROVIDER_SITE_OTHER): Payer: Medicare Other | Admitting: *Deleted

## 2014-08-22 DIAGNOSIS — I442 Atrioventricular block, complete: Secondary | ICD-10-CM | POA: Diagnosis not present

## 2014-08-22 LAB — MDC_IDC_ENUM_SESS_TYPE_INCLINIC
Battery Remaining Longevity: 116.4 mo
Brady Statistic RV Percent Paced: 99.98 %
Date Time Interrogation Session: 20160309144209
Implantable Pulse Generator Model: 2240
Implantable Pulse Generator Serial Number: 7710764
Lead Channel Impedance Value: 400 Ohm
Lead Channel Impedance Value: 400 Ohm
Lead Channel Pacing Threshold Amplitude: 0.625 V
Lead Channel Pacing Threshold Amplitude: 0.75 V
Lead Channel Pacing Threshold Pulse Width: 0.4 ms
Lead Channel Pacing Threshold Pulse Width: 0.4 ms
Lead Channel Sensing Intrinsic Amplitude: 4.1 mV
Lead Channel Setting Pacing Amplitude: 1.75 V
Lead Channel Setting Pacing Pulse Width: 0.4 ms
Lead Channel Setting Sensing Sensitivity: 4 mV
MDC IDC MSMT BATTERY VOLTAGE: 3.04 V
MDC IDC MSMT LEADCHNL RV SENSING INTR AMPL: 7.8 mV
MDC IDC SET LEADCHNL RV PACING AMPLITUDE: 0.875
MDC IDC STAT BRADY RA PERCENT PACED: 83 %

## 2014-08-22 MED ORDER — DOXYCYCLINE HYCLATE 200 MG PO TBEC: 200.0000 mg | DELAYED_RELEASE_TABLET | Freq: Two times a day (BID) | ORAL | Status: DC

## 2014-08-22 NOTE — Progress Notes (Signed)
Wound check appointment. Steri-strips removed by the patient.  Wound has moderate swelling and redness, stitch removed.  Per Dr. Graciela HusbandsKlein, patient to start Doxycycline 200mg  bid for 5 days with ROV 3/24 for a recheck when Dr. Ladona Ridgelaylor is in the office.   Normal device function. Thresholds, sensing, and impedances consistent with implant measurements. Device programmed at 3.5V/auto capture programmed on for extra safety margin until 3 month visit. Histogram distribution appropriate for patient and level of activity. 179 mode switches, atrial tachy with max atrial rate 199bpm, - anticoagulation.  No high ventricular rates noted. Patient educated about wound care, arm mobility, lifting restrictions. ROV in 3 months with implanting physician.

## 2014-08-23 ENCOUNTER — Other Ambulatory Visit: Payer: Self-pay | Admitting: *Deleted

## 2014-08-23 ENCOUNTER — Telehealth: Payer: Self-pay | Admitting: Internal Medicine

## 2014-08-23 NOTE — Telephone Encounter (Signed)
New Msg       Pt calling to get generic prescription for an antibiotic called into his pharmacy.   Pt unable to afford the one that was called in yesterday. Pt doesn't remember the name.  Please call.

## 2014-08-23 NOTE — Telephone Encounter (Signed)
Spoke with [facility]ist at [facility]. The 200 mg tablet only comes in Brand. Per patient his out of pocket cost is over $500.  The pharmacist will fill for 100 mg tabs-- take 2 tablets twice a day for 5 days.  Pt stated he will call the pharmacy this evening and plan to pick up.

## 2014-09-04 ENCOUNTER — Encounter: Payer: Self-pay | Admitting: Internal Medicine

## 2014-09-06 ENCOUNTER — Ambulatory Visit (INDEPENDENT_AMBULATORY_CARE_PROVIDER_SITE_OTHER): Payer: Medicare Other | Admitting: *Deleted

## 2014-09-06 DIAGNOSIS — I442 Atrioventricular block, complete: Secondary | ICD-10-CM

## 2014-09-06 NOTE — Progress Notes (Signed)
Site well healed with minor swelling and redness. GTassessed site and no changes. Antibiotics were finished on 09-02-14. Follow up as planned. Pt was instructed to send manual transmission with [employer] transmitter.

## 2014-09-20 ENCOUNTER — Telehealth: Payer: Self-pay | Admitting: Internal Medicine

## 2014-09-20 DIAGNOSIS — G4730 Sleep apnea, unspecified: Secondary | ICD-10-CM

## 2014-09-20 NOTE — Telephone Encounter (Signed)
Patient st he called [facility] and they need an order for a new CPAP, that he should be eligible for one. Per Dr. Mayford Knifeurner, OK to order new PAP equipment. [facility]

## 2014-09-20 NOTE — Telephone Encounter (Signed)
Pt has a cpap we ordered years ago-it is broken and wants Tresa EndoKelly to order another one through [facility]--pls call

## 2014-09-20 NOTE — Telephone Encounter (Signed)
Dr Mayford Knifeurner must have ordered will forward to her nurse to check

## 2014-09-21 ENCOUNTER — Telehealth: Payer: Self-pay | Admitting: *Deleted

## 2014-09-21 NOTE — Telephone Encounter (Signed)
Melissa with [facility] called to say tat they do not have a copy of [name]'s sleep study. She said that his insurance is going to require it. Pt is aware that he is now to [facility] what needs to happen now to get his new machine.   [facility], he is on a BiPAP machine, but they do not have the settings because they do not have the original sleep study and orders.   [name] is good with what he has been told today, and he said he is willing to do whatever he needs to do in order to get his new machine.    I told him that [facility], and that if I had any additional information for him then I would call him.  He stated that he appreciated all he effort that was being put into this.

## 2014-09-21 NOTE — Telephone Encounter (Signed)
Patient is calling to see if he can get a new CPAP Machine. He said that he has not had a new machine in 20 years and the one he currently has makes a loud whistle noise and its just wore out and will not work. He said that [facility] and they told him that if he wanted new stuff then he would have to see if Dr Mayford Knifeurner would write new orders for a machine.   Pt stated that he is very desperate for help. [facility] told him that they could help him as long as we could get orders to them.   Patient was starting to get rough with his words and stated that if he didn't get help then he  would go rob [facility] and steal a machine if that's what he had to do. I told him that I would forward a message to Dr. Mayford Knifeurner but that she was not in the office today so I was not quite sure what could be done. I told him I would do my best to help but that I could not send [facility] without orders from Dr Mayford Knifeurner to do so.    I have forwarded this message to Dr. Mayford Knifeurner for advice.

## 2014-09-21 NOTE — Telephone Encounter (Signed)
Upon further investigation, it was seen that Orpha BurKaty had taken care of this late on 4/7 . [facility] and [facility] are working on the [facility].  I have talked to the patient and he is thrilled. Melissa with [facility] told me that she would call me this afternoon and let me know the status of the order, and I have let the patient know that I will contact him as soon as I hear from them.

## 2014-09-22 NOTE — Telephone Encounter (Signed)
Please find out from patient where his most recent sleep study was done

## 2014-09-24 ENCOUNTER — Telehealth: Payer: Self-pay | Admitting: Cardiology

## 2014-09-24 NOTE — Telephone Encounter (Signed)
Spoke with [facility]. Patient had appointment at 1730 tonight to get supplies.

## 2014-09-24 NOTE — Telephone Encounter (Signed)
Called [facility] and instructed them to call the patient now. Representative Inetta Fermoina st she is calling the patient.

## 2014-09-24 NOTE — Telephone Encounter (Signed)
New message     Pt want Toma CopierBethany to know that Melissa from [facility] has not called and he has not received any CPAP equipment/supplies.  He said Toma CopierBethany promised someone would call him today.  Please call him back today.

## 2014-09-24 NOTE — Telephone Encounter (Signed)
On Friday 4/8 around 5:15 pm, I [facility] and she stated that they would be giving [name] a loner machine until they were able to figure things out with his insurance and get his replacement completed. I let Mr [name] know that [facility].

## 2014-10-24 ENCOUNTER — Telehealth: Payer: Self-pay | Admitting: Internal Medicine

## 2014-10-24 NOTE — Telephone Encounter (Signed)
Form was completed and placed in bin up front to be faxed 10/24/14 pm  It was faxed and [facility]

## 2014-10-25 NOTE — Telephone Encounter (Signed)
Victorino D[facility]Jennifer from [facility] notified that form has been faxed back. Nothing further needed.

## 2014-10-30 ENCOUNTER — Telehealth: Payer: Self-pay | Admitting: *Deleted

## 2014-10-30 ENCOUNTER — Other Ambulatory Visit: Payer: Self-pay | Admitting: *Deleted

## 2014-10-30 DIAGNOSIS — G4733 Obstructive sleep apnea (adult) (pediatric): Secondary | ICD-10-CM

## 2014-10-30 NOTE — Addendum Note (Signed)
Addended by: Arcola JanskyOOK, BETHANY M on: 10/30/2014 12:14 PM   Modules accepted: Orders

## 2014-10-30 NOTE — Telephone Encounter (Signed)
Spoke with pt to let him know that he is scheduled for a split night sleep study on 01/11/15 at 8:00pm. He was thankful for the effort in getting this scheduled for a Friday night so that he will have someone to stay with his wife.  Once we have the sleep study results, [facility] will pull the orders for a new BiPAP machine.

## 2014-11-14 ENCOUNTER — Encounter: Payer: Self-pay | Admitting: Internal Medicine

## 2014-11-14 ENCOUNTER — Encounter: Payer: Medicare Other | Admitting: Internal Medicine

## 2014-11-14 ENCOUNTER — Ambulatory Visit (INDEPENDENT_AMBULATORY_CARE_PROVIDER_SITE_OTHER): Payer: Medicare Other | Admitting: Internal Medicine

## 2014-11-14 VITALS — BP 112/64 | HR 60 | Ht 72.0 in | Wt 273.4 lb

## 2014-11-14 DIAGNOSIS — I10 Essential (primary) hypertension: Secondary | ICD-10-CM

## 2014-11-14 DIAGNOSIS — I2510 Atherosclerotic heart disease of native coronary artery without angina pectoris: Secondary | ICD-10-CM | POA: Diagnosis not present

## 2014-11-14 DIAGNOSIS — Z950 Presence of cardiac pacemaker: Secondary | ICD-10-CM | POA: Diagnosis not present

## 2014-11-14 DIAGNOSIS — I442 Atrioventricular block, complete: Secondary | ICD-10-CM | POA: Diagnosis not present

## 2014-11-14 DIAGNOSIS — I2583 Coronary atherosclerosis due to lipid rich plaque: Principal | ICD-10-CM

## 2014-11-14 LAB — CUP PACEART INCLINIC DEVICE CHECK
Battery Remaining Longevity: 115.2 mo
Battery Voltage: 3.01 V
Brady Statistic RA Percent Paced: 89 %
Brady Statistic RV Percent Paced: 99.86 %
Lead Channel Impedance Value: 462.5 Ohm
Lead Channel Pacing Threshold Amplitude: 0.75 V
Lead Channel Pacing Threshold Pulse Width: 0.4 ms
Lead Channel Setting Pacing Amplitude: 1 V
Lead Channel Setting Pacing Pulse Width: 0.4 ms
Lead Channel Setting Sensing Sensitivity: 3.5 mV
MDC IDC MSMT LEADCHNL RA IMPEDANCE VALUE: 400 Ohm
MDC IDC MSMT LEADCHNL RA PACING THRESHOLD AMPLITUDE: 0.75 V
MDC IDC MSMT LEADCHNL RA PACING THRESHOLD PULSEWIDTH: 0.4 ms
MDC IDC MSMT LEADCHNL RA SENSING INTR AMPL: 3.5 mV
MDC IDC MSMT LEADCHNL RV SENSING INTR AMPL: 10.2 mV
MDC IDC PG SERIAL: 7710764
MDC IDC SESS DTM: 20160601152937
MDC IDC SET LEADCHNL RA PACING AMPLITUDE: 1.75 V

## 2014-11-14 NOTE — Assessment & Plan Note (Signed)
He is encouraged to lose weight by eating less and moving more.

## 2014-11-14 NOTE — Progress Notes (Signed)
HPI [name] returns today for PPM followup. 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 3 months ago. He has done well in the interim except for a URI. He denies chest pain or sob. Minimal edema. No fever or chills. His productive cough is resolving. Allergies  Allergen Reactions  . Penicillins     REACTION: swollen tongue and eyes     Current Outpatient Prescriptions  Medication Sig Dispense Refill  . acetaminophen (TYLENOL) 325 MG tablet Take 1-2 tablets (325-650 mg total) by mouth every 4 (four) hours as needed for mild pain.    Marland Kitchen. amLODipine (NORVASC) 5 MG tablet Take 5 mg by mouth daily.     Marland Kitchen. aspirin EC 81 MG tablet Take 81 mg by mouth daily.    . B-D INS SYR ULTRAFINE 1CC/30G 30G X 1/2" 1 ML MISC   12  . Doxycycline Hyclate 200 MG TBEC Take 200 mg by mouth 2 (two) times daily. 10 tablet 0  . FLUoxetine (PROZAC) 10 MG capsule Take 10 mg by mouth daily.     . insulin NPH Human (HUMULIN N,NOVOLIN N) 100 UNIT/ML injection Inject 10 Units into the skin 2 (two) times daily before a meal.     . insulin regular (NOVOLIN R,HUMULIN R) 100 units/mL injection Inject 8 Units into the skin 2 (two) times daily before a meal.     . levothyroxine (SYNTHROID, LEVOTHROID) 200 MCG tablet Take 200 mcg by mouth daily.  7  . Multiple Vitamin (MULITIVITAMIN WITH MINERALS) TABS Take 1 tablet by mouth daily.    . nitroGLYCERIN (NITROSTAT) 0.4 MG SL tablet Place 0.4 mg under the tongue every 5 (five) minutes as needed for chest pain.    . Olmesartan Medoxomil (BENICAR PO) Take 10 mg by mouth daily.    . ONE TOUCH ULTRA TEST test strip   12  . pantoprazole (PROTONIX) 40 MG tablet Take 40 mg by mouth daily.     No current facility-administered medications for this visit.     Past Medical History  Diagnosis Date  . Neck abscess   . Diabetes mellitus   . Hypertension   . MRSA (methicillin resistant Staphylococcus aureus)   . Coronary artery disease cath 2006   nonobstructive ASCAD with 30% RCA  . Shortness of breath   . Sleep apnea     cpap  . Hypothyroidism   . Depression   . GERD (gastroesophageal reflux disease)   . Anxiety     ROS:   All systems reviewed and negative except as noted in the HPI.   Past Surgical History  Procedure Laterality Date  . Back surgery    . Abscess drainage    . Amputation Right 02/22/2014    Procedure: AMPUTATION RAY RIGHT FOURTH AND FIFTH;  Surgeon: Toni ArthursJohn Hewitt, MD;  Location: [facility] OR;  Service: Orthopedics;  Laterality: Right;  . Permanent pacemaker insertion N/A 08/10/2014    Procedure: PERMANENT PACEMAKER INSERTION;  Surgeon: Marinus MawGregg W Darryl Blumenstein, MD;  [facility];  Service: Cardiovascular;  Laterality: N/A;     Family History  Problem Relation Age of Onset  . Heart disease Mother   . Heart disease Father      History   Social History  . Marital Status: Married    Spouse Name: N/A  . Number of Children: N/A  . Years of Education: N/A   Occupational History  . retired    Social History Main Topics  .  Smoking status: Former Smoker -- 0.50 packs/day for 10 years    Types: Cigarettes    Quit date: 06/15/1973  . Smokeless tobacco: Never Used  . Alcohol Use: No  . Drug Use: No  . Sexual Activity: Not on file   Other Topics Concern  . Not on file   Social History Narrative     BP 112/64 mmHg  Pulse 60  Ht 6' (1.829 m)  Wt 273 lb 6.4 oz (124.013 kg)  BMI 37.07 kg/m2  Physical Exam:  Well appearing 69 yo man, NAD HEENT: Unremarkable Neck:  No JVD, no thyromegally Back:  No CVA tenderness Lungs:  Clear with no wheezes, well healed PPM incision. HEART:  Regular rate rhythm, no murmurs, no rubs, no clicks Abd:  soft, positive bowel sounds, no organomegally, no rebound, no guarding Ext:  2 plus pulses, no edema, no cyanosis, no clubbing Skin:  No rashes no nodules Neuro:  CN II through XII intact, motor grossly intact   DEVICE  Normal device function.  See PaceArt for  details.   Assess/Plan:

## 2014-11-14 NOTE — Assessment & Plan Note (Signed)
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 He will continue his current programmed settings.

## 2014-11-14 NOTE — Assessment & Plan Note (Signed)
His blood pressure is well controlled. No change in medications. He will reduce his salt intake and lose weight.

## 2014-11-14 NOTE — Assessment & Plan Note (Signed)
His [employer] DDD PM is working normally. Will recheck in several months. 

## 2014-11-14 NOTE — Patient Instructions (Signed)
Medication Instructions:  Your physician recommends that you continue on your current medications as directed. Please refer to the Current Medication list given to you today.   Labwork: NONE  Testing/Procedures: NONE  Follow-Up: Your physician wants you to follow-up in: 9 months with Dr. Ladona Ridgelaylor. You will receive a reminder letter in the mail two months in advance. If you don't receive a letter, please call our office to schedule the follow-up appointment.  Remote monitoring is used to monitor your Pacemaker or ICD from home. This monitoring reduces the number of office visits required to check your device to one time per year. It allows us to keep an eye on the functioning of your device to ensure it is working properly. You are scheduled for a device check from home on 02/13/2015. You may send your transmission at any time that day. If you have a wireless device, the transmission will be sent automatically. After your physician reviews your transmission, you will receive a postcard with your next transmission date.      Any Other Special Instructions Will Be Listed Below (If Applicable).

## 2015-01-11 ENCOUNTER — Ambulatory Visit (HOSPITAL_BASED_OUTPATIENT_CLINIC_OR_DEPARTMENT_OTHER): Payer: Medicare Other | Attending: Cardiology | Admitting: *Deleted

## 2015-01-11 VITALS — Ht 72.0 in | Wt 272.0 lb

## 2015-01-11 DIAGNOSIS — R0683 Snoring: Secondary | ICD-10-CM | POA: Diagnosis not present

## 2015-01-11 DIAGNOSIS — G4733 Obstructive sleep apnea (adult) (pediatric): Secondary | ICD-10-CM | POA: Insufficient documentation

## 2015-01-20 ENCOUNTER — Telehealth: Payer: Self-pay | Admitting: Cardiology

## 2015-01-20 DIAGNOSIS — G4733 Obstructive sleep apnea (adult) (pediatric): Secondary | ICD-10-CM

## 2015-01-20 NOTE — Telephone Encounter (Signed)
Please let patient know that they have sleep apnea but not sufficient time for adequate CPAP titration. . Please set up full night CPAP titration in the sleep lab.

## 2015-01-20 NOTE — Telephone Encounter (Signed)
THere appeared to be frequent nonsustained atrial tachcyardia on EKG during sleep study.  Please have pacer interrogated

## 2015-01-20 NOTE — Sleep Study (Signed)
SLEEP STUDY   Patient Name: [name], [name] Date: 01/11/2015 Gender: Male D.O.B: 1945/07/08 Age (years): 48 Referring Provider: Fransico Him MD, ABSM Height (inches): 72 Interpreting Physician: Fransico Him MD, ABSM Weight (lbs): 287 RPSGT: Gerhard Perches BMI: 60 MRN: [id number] Neck Size: 18.00  CLINICAL INFORMATION Sleep Study Type: Split Night CPAP  Indication for sleep study: Diabetes, Hypertension, Obesity, OSA, Re-Evaluation, Witnessed Apneas  Epworth Sleepiness Score: 3  SLEEP STUDY TECHNIQUE As per the AASM Manual for the Scoring of Sleep and Associated Events v2.3 (April 2016) with a hypopnea requiring 4% desaturations.  The channels recorded and monitored were frontal, central and occipital EEG, electrooculogram (EOG), submentalis EMG (chin), nasal and oral airflow, thoracic and abdominal wall motion, anterior tibialis EMG, snore microphone, electrocardiogram, and pulse oximetry. Continuous positive airway pressure (CPAP) was initiated when the patient met split night criteria and was titrated according to treat sleep-disordered breathing.  MEDICATIONS Medications taken by the patient : Norvasc, ASA, Prozac, Insulin, Synthroid, Benicar, Protonix Medications administered by patient during sleep study : No sleep medicine administered.  RESPIRATORY PARAMETERS  Diagnostic Total AHI (/hr): 63.3  RDI (/hr):63.3   CA Index (/hr): 0.5 REM AHI (/hr): 81.2  NREM AHI (/hr):60.5  Supine AHI (/hr):0.0   Non-supine AHI (/hr):63.72 Min O2 Sat (%):79.00  Mean O2 (%): 85.49  Time below 88% (min):112.0    Titration Optimal Pressure (cm):14 AHI at Optimal Pressure (/hr):0.0 Min O2 at Optimal Pressure (%):90.0 Supine % at Optimal (%):0 Sleep % at Optimal (%):100    SLEEP ARCHITECTURE The recording time for the entire night was 378.2 minutes.  During a baseline period of 191.7 minutes, the patient slept for 129.0 minutes in REM and nonREM, yielding a reduced sleep  efficiency of 67.3%. Sleep onset after lights out was 49.1 minutes with a REM latency of 51.5 minutes. The patient spent 12.79% of the night in stage N1 sleep, 74.03% in stage N2 sleep, 0.00% in stage N3 and 13.18% in REM.  During the titration period of 179.0 minutes, the patient slept for 123.1 minutes in REM and nonREM, yielding a sleep efficiency of 68.8%. Sleep onset after CPAP initiation was 21.4 minutes with a REM latency of 84.5 minutes. The patient spent 8.53% of the night in stage N1 sleep, 84.57% in stage N2 sleep, 0.00% in stage N3 and 6.90% in REM.  CARDIAC DATA The 2 lead EKG demonstrated sinus rhythm with frequent PACs, occasional PVCs and frequent episodes of nonsustained atrial tachycardia. The mean heart rate was 64.55 beats per minute.  LEG MOVEMENT DATA The total Periodic Limb Movements of Sleep (PLMS) were 81. The PLMS index was 19.28 .  IMPRESSIONS Severe obstructive sleep apnea occurred during the diagnostic portion of the study (AHI = 63.3/hour). An optimal PAP pressure was not obtained during REM sleep. No significant central sleep apnea occurred during the diagnostic portion of the study (CAI = 0.5/hour). Severe oxygen desaturation was noted during the diagnostic portion of the study (Min O2 = 79.00%). The patient snored with Soft snoring volume during the diagnostic portion of the study. Frequent PAC's and nonsustained atrial tachycardia were noted. Clinically significant periodic limb movements did not occur during sleep.  DIAGNOSIS Obstructive Sleep Apnea (327.23 [G47.33 ICD-10])  RECOMMENDATIONS A full night CPAP titration is recommended since the patient could not be titrated in REM supine sleep during this study. Avoid alcohol, sedatives and other CNS depressants that may worsen sleep apnea and disrupt normal sleep architecture. Sleep hygiene should be reviewed to assess factors  that may improve sleep quality. Weight management and regular exercise should be  initiated or continued.  Signed: Fransico Him, MD ABSM Diplomate, American Board of Sleep Medicine 01/20/2015

## 2015-01-21 ENCOUNTER — Encounter: Payer: Self-pay | Admitting: Cardiology

## 2015-01-21 NOTE — Telephone Encounter (Signed)
EP spoke with patient and he is sending in remote check today.

## 2015-01-21 NOTE — Telephone Encounter (Signed)
Report printed and given to Karsten Fells, RN. sss

## 2015-01-21 NOTE — Telephone Encounter (Signed)
To Device Clinic.  

## 2015-01-22 ENCOUNTER — Encounter: Payer: Self-pay | Admitting: *Deleted

## 2015-01-22 NOTE — Telephone Encounter (Signed)
Patient is aware of results.  He is currently on a BiPAP machine, so I will put in the order for Titration.   According to [facility], he again has to fail CPAP in order for insurance to pay for BiPAP.

## 2015-01-22 NOTE — Addendum Note (Signed)
Addended by: Arcola Jansky on: 01/22/2015 02:20 PM   Modules accepted: Orders

## 2015-01-22 NOTE — Telephone Encounter (Signed)
Left message to call back  

## 2015-02-05 ENCOUNTER — Telehealth: Payer: Self-pay | Admitting: Cardiology

## 2015-02-05 MED ORDER — METOPROLOL SUCCINATE ER 25 MG PO TB24: 25.0000 mg | ORAL_TABLET | Freq: Every day | ORAL | Status: DC

## 2015-02-05 NOTE — Addendum Note (Signed)
Addended by: Sherri Rad C on: 02/05/2015 04:47 PM   Modules accepted: Orders, Medications

## 2015-02-05 NOTE — Telephone Encounter (Signed)
The patient is aware of Dr. Norris Cross recommendations. He will discontinue amlodipine and start metoprolol succinate 25 mg once daily. He is aware not to take both medications. He does have a BP monitor at home and will keep track of his BP/ HR readings x 1 week, then call to follow up. His next remote device check is scheduled for 02/13/15. He is scheduled for a C-PAP titration on 04/12/15. [facility] [address] [facility] the patient's request.

## 2015-02-05 NOTE — Telephone Encounter (Signed)
Patient having frequent runs of nonsustained atrial tachycardia on pacer check.  This was also noted during sleep study.  Please stop amlodipine and start Toprol XL  daily and have patient check BP daly for a week and call with results.

## 2015-02-08 ENCOUNTER — Encounter: Payer: Self-pay | Admitting: Internal Medicine

## 2015-02-09 ENCOUNTER — Other Ambulatory Visit: Payer: Self-pay | Admitting: Internal Medicine

## 2015-02-13 ENCOUNTER — Ambulatory Visit (INDEPENDENT_AMBULATORY_CARE_PROVIDER_SITE_OTHER): Payer: Medicare Other | Admitting: *Deleted

## 2015-02-13 DIAGNOSIS — I442 Atrioventricular block, complete: Secondary | ICD-10-CM

## 2015-02-13 NOTE — Progress Notes (Signed)
Remote pacemaker transmission.   

## 2015-02-15 ENCOUNTER — Other Ambulatory Visit: Payer: Self-pay | Admitting: Internal Medicine

## 2015-02-21 LAB — CUP PACEART REMOTE DEVICE CHECK
Battery Remaining Longevity: 116 mo
Battery Remaining Percentage: 95.5 %
Brady Statistic AP VP Percent: 92 %
Brady Statistic AP VS Percent: 1 %
Brady Statistic AS VP Percent: 7.5 %
Brady Statistic AS VS Percent: 1 %
Date Time Interrogation Session: 20160831065806
Lead Channel Impedance Value: 410 Ohm
Lead Channel Pacing Threshold Amplitude: 0.75 V
Lead Channel Pacing Threshold Amplitude: 0.75 V
Lead Channel Pacing Threshold Pulse Width: 0.4 ms
Lead Channel Pacing Threshold Pulse Width: 0.4 ms
Lead Channel Sensing Intrinsic Amplitude: 11 mV
Lead Channel Setting Pacing Amplitude: 1 V
Lead Channel Setting Pacing Amplitude: 1.75 V
Lead Channel Setting Pacing Pulse Width: 0.4 ms
Lead Channel Setting Sensing Sensitivity: 3.5 mV
MDC IDC MSMT BATTERY VOLTAGE: 3.01 V
MDC IDC MSMT LEADCHNL RA IMPEDANCE VALUE: 410 Ohm
MDC IDC MSMT LEADCHNL RA SENSING INTR AMPL: 5 mV
MDC IDC STAT BRADY RA PERCENT PACED: 92 %
MDC IDC STAT BRADY RV PERCENT PACED: 99 %
Pulse Gen Model: 2240
Pulse Gen Serial Number: 7710764

## 2015-02-25 ENCOUNTER — Other Ambulatory Visit: Payer: Self-pay | Admitting: Internal Medicine

## 2015-02-27 ENCOUNTER — Encounter: Payer: Self-pay | Admitting: Cardiology

## 2015-03-02 ENCOUNTER — Other Ambulatory Visit: Payer: Self-pay | Admitting: Internal Medicine

## 2015-03-04 ENCOUNTER — Emergency Department (HOSPITAL_COMMUNITY)
Admission: EM | Admit: 2015-03-04 | Discharge: 2015-03-04 | Discharge status: Against Medical Advice | Payer: Medicare Other | Attending: Emergency Medicine | Admitting: Emergency Medicine

## 2015-03-04 ENCOUNTER — Emergency Department (HOSPITAL_COMMUNITY): Payer: Medicare Other

## 2015-03-04 ENCOUNTER — Encounter (HOSPITAL_COMMUNITY): Payer: Self-pay | Admitting: Emergency Medicine

## 2015-03-04 DIAGNOSIS — E119 Type 2 diabetes mellitus without complications: Secondary | ICD-10-CM | POA: Insufficient documentation

## 2015-03-04 DIAGNOSIS — I2510 Atherosclerotic heart disease of native coronary artery without angina pectoris: Secondary | ICD-10-CM | POA: Insufficient documentation

## 2015-03-04 DIAGNOSIS — R079 Chest pain, unspecified: Secondary | ICD-10-CM | POA: Insufficient documentation

## 2015-03-04 DIAGNOSIS — R0602 Shortness of breath: Secondary | ICD-10-CM | POA: Insufficient documentation

## 2015-03-04 DIAGNOSIS — I10 Essential (primary) hypertension: Secondary | ICD-10-CM | POA: Insufficient documentation

## 2015-03-04 LAB — CBC
HCT: 46.7 % (ref 39.0–52.0)
Hemoglobin: 16.1 g/dL (ref 13.0–17.0)
MCH: 32.7 pg (ref 26.0–34.0)
MCHC: 34.5 g/dL (ref 30.0–36.0)
MCV: 94.9 fL (ref 78.0–100.0)
PLATELETS: 144 10*3/uL — AB (ref 150–400)
RBC: 4.92 MIL/uL (ref 4.22–5.81)
RDW: 13.6 % (ref 11.5–15.5)
WBC: 9.3 10*3/uL (ref 4.0–10.5)

## 2015-03-04 LAB — BASIC METABOLIC PANEL
Anion gap: 10 (ref 5–15)
BUN: 27 mg/dL — ABNORMAL HIGH (ref 6–20)
CO2: 28 mmol/L (ref 22–32)
CREATININE: 2.03 mg/dL — AB (ref 0.61–1.24)
Calcium: 9.3 mg/dL (ref 8.9–10.3)
Chloride: 99 mmol/L — ABNORMAL LOW (ref 101–111)
GFR calc Af Amer: 36 mL/min — ABNORMAL LOW (ref >60)
GFR, EST NON AFRICAN AMERICAN: 31 mL/min — AB (ref >60)
Glucose, Bld: 216 mg/dL — ABNORMAL HIGH (ref 65–99)
Potassium: 4.4 mmol/L (ref 3.5–5.1)
SODIUM: 137 mmol/L (ref 135–145)

## 2015-03-04 LAB — I-STAT TROPONIN, ED: Troponin i, poc: 0.02 ng/mL (ref 0.00–0.08)

## 2015-03-04 NOTE — ED Notes (Signed)
Pt from home for eval of left sided cp x4 days, pt states pain has been intermittent sharp pain with no radiation. Pt with recent pacemaker placed x4 months ago. Pt states some sob denies any n/v/d at this time. Pt axo, skin warm and dry. States he has nitro at home but has not taken any for pain.

## 2015-03-05 ENCOUNTER — Ambulatory Visit (INDEPENDENT_AMBULATORY_CARE_PROVIDER_SITE_OTHER): Payer: Medicare Other | Admitting: Physician Assistant

## 2015-03-05 ENCOUNTER — Encounter: Payer: Self-pay | Admitting: Internal Medicine

## 2015-03-05 ENCOUNTER — Ambulatory Visit (INDEPENDENT_AMBULATORY_CARE_PROVIDER_SITE_OTHER): Payer: Medicare Other | Admitting: *Deleted

## 2015-03-05 ENCOUNTER — Telehealth: Payer: Self-pay | Admitting: Internal Medicine

## 2015-03-05 VITALS — BP 108/72 | HR 60 | Ht 72.0 in | Wt 279.0 lb

## 2015-03-05 DIAGNOSIS — R0602 Shortness of breath: Secondary | ICD-10-CM

## 2015-03-05 DIAGNOSIS — E119 Type 2 diabetes mellitus without complications: Secondary | ICD-10-CM

## 2015-03-05 DIAGNOSIS — N183 Chronic kidney disease, stage 3 (moderate): Secondary | ICD-10-CM

## 2015-03-05 DIAGNOSIS — I471 Supraventricular tachycardia: Secondary | ICD-10-CM

## 2015-03-05 DIAGNOSIS — I4719 Other supraventricular tachycardia: Secondary | ICD-10-CM

## 2015-03-05 DIAGNOSIS — R079 Chest pain, unspecified: Secondary | ICD-10-CM

## 2015-03-05 DIAGNOSIS — Z950 Presence of cardiac pacemaker: Secondary | ICD-10-CM

## 2015-03-05 DIAGNOSIS — I10 Essential (primary) hypertension: Secondary | ICD-10-CM | POA: Diagnosis not present

## 2015-03-05 DIAGNOSIS — I442 Atrioventricular block, complete: Secondary | ICD-10-CM

## 2015-03-05 DIAGNOSIS — I2510 Atherosclerotic heart disease of native coronary artery without angina pectoris: Secondary | ICD-10-CM | POA: Diagnosis not present

## 2015-03-05 DIAGNOSIS — J441 Chronic obstructive pulmonary disease with (acute) exacerbation: Secondary | ICD-10-CM

## 2015-03-05 DIAGNOSIS — G4733 Obstructive sleep apnea (adult) (pediatric): Secondary | ICD-10-CM

## 2015-03-05 LAB — CUP PACEART INCLINIC DEVICE CHECK
Battery Voltage: 3.01 V
Date Time Interrogation Session: 20160920163155
Lead Channel Impedance Value: 462.5 Ohm
Lead Channel Impedance Value: 462.5 Ohm
Lead Channel Pacing Threshold Pulse Width: 0.4 ms
Lead Channel Setting Pacing Amplitude: 1 V
Lead Channel Setting Pacing Amplitude: 1.75 V
Lead Channel Setting Sensing Sensitivity: 3.5 mV
MDC IDC MSMT BATTERY REMAINING LONGEVITY: 112.8 mo
MDC IDC MSMT LEADCHNL RA PACING THRESHOLD AMPLITUDE: 0.75 V
MDC IDC MSMT LEADCHNL RA PACING THRESHOLD PULSEWIDTH: 0.4 ms
MDC IDC MSMT LEADCHNL RA SENSING INTR AMPL: 5 mV
MDC IDC MSMT LEADCHNL RV PACING THRESHOLD AMPLITUDE: 0.75 V
MDC IDC MSMT LEADCHNL RV SENSING INTR AMPL: 11.2 mV
MDC IDC SET LEADCHNL RV PACING PULSEWIDTH: 0.4 ms
MDC IDC STAT BRADY RA PERCENT PACED: 93 %
MDC IDC STAT BRADY RV PERCENT PACED: 99.93 %
Pulse Gen Serial Number: 7710764

## 2015-03-05 LAB — TROPONIN I: Troponin I: 0.02 ng/mL (ref <0.06)

## 2015-03-05 LAB — D-DIMER, QUANTITATIVE: D-Dimer, Quant: 0.57 ug/mL-FEU — ABNORMAL HIGH (ref 0.00–0.48)

## 2015-03-05 MED ORDER — DILTIAZEM HCL ER COATED BEADS 120 MG PO CP24: 120.0000 mg | ORAL_CAPSULE | Freq: Every day | ORAL | Status: DC

## 2015-03-05 MED ORDER — FLUTICASONE FUROATE-VILANTEROL 100-25 MCG/INH IN AEPB: 1.0000 | INHALATION_SPRAY | Freq: Every day | RESPIRATORY_TRACT | Status: AC

## 2015-03-05 MED ORDER — FUROSEMIDE 40 MG PO TABS: 40.0000 mg | ORAL_TABLET | ORAL | Status: DC

## 2015-03-05 NOTE — Telephone Encounter (Signed)
Intermittent stabbing chest pain since last Friday. Worse yesterday than today. Went to [facility] ED yesterday. Felt he was not taken care of appropriately - finally left after waiting 5 hours and still not seen by MD.  Does have SOB. CP continues intermittently and is intermittent about every 10- 15 minutes. Pt does have pacemaker. Denies dizziness or other sx. States he is very concerned with CP sx and wants to be evaluation. Scheduled with Tereso Newcomer, PA, for today at 3:00 pm. Patient verbalized he is appreciative and will arrive 15 minutes early to sign in .

## 2015-03-05 NOTE — Telephone Encounter (Signed)
This encounter was created in error - please disregard.

## 2015-03-05 NOTE — Progress Notes (Signed)
Cardiology Office Note   Date:  03/05/2015   ID:  [name], DOB [birth year], MRN [id number]  PCP:  [name], MD  Cardiologist:  Dr. Armanda Magic   Electrophysiologist:  Dr. Lewayne Bunting   No chief complaint on file.    History of Present Illness: [name] is a 69 y.o. 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  Patient was noted to be in CHB during an echo in 2/16 and subsequent underwent dual chamber PPM with Dr. Ladona Ridgel.    Patient went to the ED yesterday with chest pain and SOB.  He left before seeing the MD.  CXR was unremarkable.  On POC Troponin was neg.  He returns for further evaluation.    He has been having sharp L sided chest pain for 4 days.  It only lasts seconds. It is not related to exertion or meals.  Denies pleuritic chest pain or chest pain with lying supine.  He has been more short of breath the last few days.  He is NYHA 3. Denies orthopnea, PND, edema.  Denies syncope.  No significant cough.  He denies wheezing.  He was placed on Breo by his PCP several mos ago with improved breathing.  He is no longer taking this drug.  He was recently placed on Toprol-XL in the last month for ATach noted on his device check and sleep study.     Studies/Reports Reviewed Today:  Dg Chest 2 View  03/04/2015    IMPRESSION: No acute findings.  Stable bibasilar scarring/atelectasis.   Electronically Signed   By: Elberta Fortis M.D.   On: 03/04/2015 14:58    Echo 08/06/14 Severe LVH, EF 55-60%, mild LAE  Holter 07/26/14 Mobitz 2 with junctional escape  Myoview 05/2013 Low risk stress nuclear study with a fixed defect in the inferolateral wall c/w diaphragmatic attenuation.  LV Ejection Fraction: Study not gated  LHC 07/2008 LM:  Ok LAD:  Mid 25% LCx:  AV groove 25%, PLB prox 30% RCA:  Luminal irregs EF 65%   Past Medical History  Diagnosis Date  . Neck abscess   . Diabetes mellitus   . Hypertension   . MRSA  (methicillin resistant Staphylococcus aureus)   . Coronary artery disease cath 2006    nonobstructive ASCAD with 30% RCA  . Shortness of breath   . Sleep apnea     cpap  . Hypothyroidism   . Depression   . GERD (gastroesophageal reflux disease)   . Anxiety     Past Surgical History  Procedure Laterality Date  . Back surgery    . Abscess drainage    . Amputation Right 02/22/2014    Procedure: AMPUTATION RAY RIGHT FOURTH AND FIFTH;  Surgeon: Toni Arthurs, MD;  Location: [facility] OR;  Service: Orthopedics;  Laterality: Right;  . Permanent pacemaker insertion N/A 08/10/2014    Procedure: PERMANENT PACEMAKER INSERTION;  Surgeon: Marinus Maw, MD;  Location: [facility] CATH LAB;  Service: Cardiovascular;  Laterality: N/A;     Current Outpatient Prescriptions  Medication Sig Dispense Refill  . acetaminophen (TYLENOL) 325 MG tablet Take 1-2 tablets (325-650 mg total) by mouth every 4 (four) hours as needed for mild pain.    Marland Kitchen aspirin EC 81 MG tablet Take 81 mg by mouth daily.    . B-D INS SYR ULTRAFINE 1CC/30G 30G X 1/2" 1 ML MISC   12  . Doxycycline Hyclate 200 MG TBEC Take  200 mg by mouth 2 (two) times daily. 10 tablet 0  . FLUoxetine (PROZAC) 10 MG capsule Take 10 mg by mouth daily.     . insulin regular (NOVOLIN R,HUMULIN R) 100 units/mL injection Inject 8 Units into the skin 2 (two) times daily before a meal.     . levothyroxine (SYNTHROID, LEVOTHROID) 200 MCG tablet Take 200 mcg by mouth daily.  7  . Multiple Vitamin (MULITIVITAMIN WITH MINERALS) TABS Take 1 tablet by mouth daily.    . nitroGLYCERIN (NITROSTAT) 0.4 MG SL tablet Place 0.4 mg under the tongue every 5 (five) minutes as needed for chest pain.    . Olmesartan Medoxomil (BENICAR PO) Take 10 mg by mouth daily.    . ONE TOUCH ULTRA TEST test strip   12  . pantoprazole (PROTONIX) 40 MG tablet TAKE 1 TABLET BY MOUTH DAILY. TAKE 30-60 MIN BEFORE FIRST MEAL OF THE DAY 30 tablet 0  . diltiazem (CARDIZEM CD) 120 MG 24 hr capsule Take 1 capsule  (120 mg total) by mouth daily. 30 capsule 11  . Fluticasone Furoate-Vilanterol 100-25 MCG/INH AEPB Inhale 1 puff into the lungs daily. 1 each 0  . furosemide (LASIX) 40 MG tablet Take 1 tablet (40 mg total) by mouth as directed. 40 MG DAILY FOR 3 DAYS THEN STOP UNLESS ADVISED BY CARDIOLOGY 30 tablet 2   No current facility-administered medications for this visit.    Allergies:   Penicillins    Social History:  The patient  reports that he quit smoking about 41 years ago. His smoking use included Cigarettes. He has a 5 pack-year smoking history. He has never used smokeless tobacco. He reports that he does not drink alcohol or use illicit drugs.   Family History:  The patient's family history includes Heart disease in his father and mother.    ROS:   Please see the history of present illness.   Review of Systems  Constitution: Positive for malaise/fatigue.  Cardiovascular: Positive for chest pain and dyspnea on exertion.  All other systems reviewed and are negative.     PHYSICAL EXAM: VS:  BP 108/72 mmHg  Pulse 60  Ht 6' (1.829 m)  Wt 279 lb (126.554 kg)  BMI 37.83 kg/m2  SpO2 88%    Wt Readings from Last 3 Encounters:  03/05/15 279 lb (126.554 kg)  03/04/15 275 lb (124.739 kg)  01/11/15 272 lb (123.378 kg)     GEN: Well nourished, well developed, in no acute distress HEENT: normal Neck: no JVD,  no masses Cardiac:  Normal S1/S2, RRR; no murmur ,  no rubs or gallops, no edema   Respiratory: decreased breath sounds bilaterally, no wheezing, rhonchi or rales. GI: soft, nontender, nondistended, + BS MS: no deformity or atrophy Skin: warm and dry  Neuro:  CNs II-XII intact, Strength and sensation are intact Psych: Normal affect   EKG:  EKG is ordered today.  It demonstrates:   AV paced, HR 60   Recent Labs: 03/04/2015: BUN 27*; Creatinine, Ser 2.03*; Hemoglobin 16.1; Platelets 144*; Potassium 4.4; Sodium 137    Lipid Panel    Component Value Date/Time   CHOL   07/26/2008 0110    108        ATP III CLASSIFICATION:  <200     mg/dL   Desirable  161-096  mg/dL   Borderline High  >=045    mg/dL   High          TRIG 409* 07/26/2008 0110   HDL  18* 07/26/2008 0110   CHOLHDL 6.0 07/26/2008 0110   VLDL 75* 07/26/2008 0110   LDLCALC  07/26/2008 0110    15        Total Cholesterol/HDL:CHD Risk Coronary Heart Disease Risk Table                     Men   Women  1/2 Average Risk   3.4   3.3  Average Risk       5.0   4.4  2 X Average Risk   9.6   7.1  3 X Average Risk  23.4   11.0        Use the calculated Patient Ratio above and the CHD Risk Table to determine the patient's CHD Risk.        ATP III CLASSIFICATION (LDL):  <100     mg/dL   Optimal  161-096  mg/dL   Near or Above                    Optimal  130-159  mg/dL   Borderline  045-409  mg/dL   High  >811     mg/dL   Very High      ASSESSMENT AND PLAN:  1. Chest Pain:  Etiology not clear.  His symptoms are atypical for ischemia.  He is AV paced on his ECG.  He had no sig CAD in 2010.  However, he does have DM2.  He does not have any pleuritic symptoms.  We checked his device today.  His device is functioning appropriately.  He does have a hx of COPD and his O2 is low on RA.  I suspect he is having a COPD exacerbation.  No significant wheezing on exam and his CXR was ok.  He also appears to be somewhat volume overloaded.  Weight is up and his abdomen is distended.  My suspicion is that his symptoms are all related to acute diastolic CHF in the setting of AECOPD.  -  Repeat stat Troponin tonight  -  Check DDimer, BNP  -  If DDimer elevated, get LE dopplers and VQ scan given CKD  -  Arrange repeat Echo   -  Arrange Lexiscan Myoview.  2. Dyspnea:  O2 is 88% on RA and drops to 84% with ambulation on RA.  He does have O2 at home.  As noted I suspect his symptoms are a combination of volume excess and COPD exacerbation.   Question if some of his dyspnea related to his beta-blocker.    -  DC  Toprol.    -  Start Cardizem 120 mg QD.    -  Resume Breo 1 puff QD. I do not think he needs oral prednisone.    -  Check BNP, DDimer, Echo, Myoview.    -  Lasix 40 mg QD x 3 days for now.  Will need to be careful with his CKD.  -  Resume wearing O2.  3. CAD:  Non-obstructive by LHC in 2010.  Doubt ischemic chest pain. Will get a repeat Troponin.  If neg, proceed with Myoview.    4. Chronic Kidney Disease:  Recent Creatinine 2.03.  He is not a good candidate for cardiac cath.  Would have to avoid cath unless his Myoview is high risk.   5. HTN:  Controlled.   6. Atrial Tachycardia:  Will DC beta-blocker given his breathing issues and replace with calcium channel blocker as noted (Diltiazem 120 mg QD).  7. OSA:   Continue CPAP.   8. S/p Pacemaker:  Implanted 2/2 complete heart block. FU with EP as planned.    9. Diabetes Mellitus:  FU with PCP.   10. COPD with Exacerbation:  Start Breo.  Arrange earlier FU with PCP.  Resume wearing O2 at home at all times.      Medication Changes: Current medicines are reviewed at length with the patient today.  Concerns regarding medicines are as outlined above.  The following changes have been made:   Discontinued Medications   INSULIN NPH HUMAN (HUMULIN N,NOVOLIN N) 100 UNIT/ML INJECTION    Inject 10 Units into the skin 2 (two) times daily before a meal.    METOPROLOL SUCCINATE (TOPROL-XL) 25 MG 24 HR TABLET    Take 1 tablet (25 mg total) by mouth daily.   PANTOPRAZOLE (PROTONIX) 40 MG TABLET    Take 40 mg by mouth daily.   Modified Medications   No medications on file   New Prescriptions   DILTIAZEM (CARDIZEM CD) 120 MG 24 HR CAPSULE    Take 1 capsule (120 mg total) by mouth daily.   FLUTICASONE FUROATE-VILANTEROL 100-25 MCG/INH AEPB    Inhale 1 puff into the lungs daily.   FUROSEMIDE (LASIX) 40 MG TABLET    Take 1 tablet (40 mg total) by mouth as directed. 40 MG DAILY FOR 3 DAYS THEN STOP UNLESS ADVISED BY CARDIOLOGY    Labs/ tests ordered  today include:   Orders Placed This Encounter  Procedures  . B Nat Peptide  . D-Dimer, Quantitative  . Troponin I  . Myocardial Perfusion Imaging  . EKG 12-Lead  . Echocardiogram     Disposition:    FU with me 1-2 weeks.     Signed, Brynda Rim, MHS 03/05/2015 6:20 PM    [facility] [address] [place], [place], [place]  [address] Phone: [phone]; Fax: [phone]

## 2015-03-05 NOTE — Telephone Encounter (Signed)
New message    Pt c/o of Chest Pain: STAT if CP now or developed within 24 hours  1. Are you having CP right now? Yes every 5 to 10 minutes  2. Are you experiencing any other symptoms (ex. SOB, nausea, vomiting, sweating)? SOB   3. How long have you been experiencing CP? Since Friday  4. Is your CP continuous or coming and going? Come and goes  5. Have you taken Nitroglycerin? No ? Pt went to e/r yesterday, had ekg; blood work and xray and ended up leaving

## 2015-03-05 NOTE — Progress Notes (Signed)
Pacemaker check in clinic add on for pain near pacer. Interrogation only. Normal device function. Thresholds, sensing, impedances consistent with previous measurements. Device programmed to maximize longevity. 14 mode switches- longest 12 seconds, pk A 210, EGMs show AT. No high ventricular rates noted. Device programmed at appropriate safety margins. Histogram distribution appropriate for patient activity level. Device programmed to optimize intrinsic conduction. Estimated longevity 9.4-10.2 years. Patient enrolled in remote follow-up. Merlin 05/16/15, follow up as scheduled.

## 2015-03-05 NOTE — Patient Instructions (Signed)
Medication Instructions:  1. STOP TOPROL XL  2. START DILTIAZEM CD 120 MG 1 TABLET DAILY  3. START LASIX 40 MG DAILY X 3 DAYS THEN STOP UNLESS ADVISED BY CARDIOLOGY  4. START BREO 100/25 1 PUFF DAILY  Labwork: 1. TODAY STAT TROPONIN I, BNP, D-DIMER  Testing/Procedures: Your physician has requested that you have an echocardiogram. Echocardiography is a painless test that uses sound waves to create images of your heart. It provides your doctor with information about the size and shape of your heart and how well your heart's chambers and valves are working. This procedure takes approximately one hour. There are no restrictions for this procedure.  Your physician has requested that you have a lexiscan myoview. For further information please visit [email or web address] Please follow instruction sheet, as given.   Follow-Up: SCOTT WEAVER, PAC I-2 WEEKS SAME DAY DR. Mayford Knife IS IN THE OFFICE IF POSSIBLE  Any Other Special Instructions Will Be Listed Below (If Applicable).

## 2015-03-06 ENCOUNTER — Telehealth: Payer: Self-pay | Admitting: *Deleted

## 2015-03-06 ENCOUNTER — Encounter: Payer: Self-pay | Admitting: Internal Medicine

## 2015-03-06 DIAGNOSIS — R0609 Other forms of dyspnea: Principal | ICD-10-CM

## 2015-03-06 DIAGNOSIS — I10 Essential (primary) hypertension: Secondary | ICD-10-CM

## 2015-03-06 LAB — BRAIN NATRIURETIC PEPTIDE: Pro B Natriuretic peptide (BNP): 75 pg/mL (ref 0.0–100.0)

## 2015-03-06 NOTE — Telephone Encounter (Signed)
"  Troponin neg. DDimer elevated >> arrange LE venous duplex (bilat) to r/o DVT and VQ scan to rule out PE." - per Tereso Newcomer, PA. Spoke with patient and he agrees with treatment plan. Orders placed in system. Informed him that someone from office would call him with date/times of diagnostic tests after they have been scheduled.

## 2015-03-06 NOTE — Telephone Encounter (Signed)
Routed to [employer] at [employer], as [employer]

## 2015-03-07 NOTE — Telephone Encounter (Signed)
Pt notified of lab results by phone. Pt states he is feeling better and he feels the lasix is working. I advised ptcb tomorrow to let me know if he is still feeling ok, if his weight has increased, more sob or more edema. Pt agreeable to plan of care.

## 2015-03-08 ENCOUNTER — Telehealth: Payer: Self-pay | Admitting: Physician Assistant

## 2015-03-08 MED ORDER — PREDNISONE 20 MG PO TABS: 20.0000 mg | ORAL_TABLET | Freq: Every day | ORAL | Status: AC

## 2015-03-08 NOTE — Telephone Encounter (Signed)
See phone note 9/23 1:34. I cb pt; he wanted to let me know saw PCP today and was given pred inj and Rx for pred 20 mg daily x 7 days. I advised pt since today was his last dose of lasix for the 3 days to monitor weight, call if up 3 # x 1 day, increased sob, increased edema. I stated for 2 reasons: 1. last day of lasix today; 2. Pcp starting prednisone; which can hold fluid and increase his glucose. Pt states PCP told him if he starts gaining too much or glucose goes too high then stop prednisone; I also advised pt again if weight is up 3# x 1 day or more sob, edema to please call the office or if over the weekend then s/w with the On-Call provider. Pt agreeable to plan of care and all instructions. I have added the prednisone to his med list to reflect the change made by PCP today.

## 2015-03-08 NOTE — Telephone Encounter (Signed)
F/u  Pt requested to speak w/ [name]- said it was important but did not go into detail. Please call back and discuss.

## 2015-03-08 NOTE — Telephone Encounter (Signed)
New message      Calling to give Okey Regal update: Wt is the same----277 lbs, SOB is the same.  He said he saw his PCP and they walked him and he was 93 (not sure if that was o2 or what).  Call him if you need more info

## 2015-03-11 ENCOUNTER — Ambulatory Visit (HOSPITAL_COMMUNITY)
Admission: RE | Admit: 2015-03-11 | Discharge: 2015-03-11 | Disposition: A | Discharge status: Home / Self Care | Payer: Medicare Other | Source: Ambulatory Visit | Attending: Physician Assistant | Admitting: Physician Assistant

## 2015-03-11 DIAGNOSIS — Z8249 Family history of ischemic heart disease and other diseases of the circulatory system: Secondary | ICD-10-CM | POA: Insufficient documentation

## 2015-03-11 DIAGNOSIS — R0609 Other forms of dyspnea: Principal | ICD-10-CM

## 2015-03-11 DIAGNOSIS — E119 Type 2 diabetes mellitus without complications: Secondary | ICD-10-CM | POA: Insufficient documentation

## 2015-03-11 DIAGNOSIS — I422 Other hypertrophic cardiomyopathy: Secondary | ICD-10-CM | POA: Diagnosis not present

## 2015-03-11 DIAGNOSIS — R079 Chest pain, unspecified: Secondary | ICD-10-CM | POA: Diagnosis not present

## 2015-03-11 DIAGNOSIS — I10 Essential (primary) hypertension: Secondary | ICD-10-CM

## 2015-03-11 DIAGNOSIS — J9811 Atelectasis: Secondary | ICD-10-CM | POA: Insufficient documentation

## 2015-03-11 DIAGNOSIS — G4733 Obstructive sleep apnea (adult) (pediatric): Secondary | ICD-10-CM | POA: Diagnosis not present

## 2015-03-11 DIAGNOSIS — I2510 Atherosclerotic heart disease of native coronary artery without angina pectoris: Secondary | ICD-10-CM | POA: Diagnosis not present

## 2015-03-11 DIAGNOSIS — Z95810 Presence of automatic (implantable) cardiac defibrillator: Secondary | ICD-10-CM | POA: Insufficient documentation

## 2015-03-11 DIAGNOSIS — Z87891 Personal history of nicotine dependence: Secondary | ICD-10-CM | POA: Diagnosis not present

## 2015-03-11 MED ORDER — TECHNETIUM TO 99M ALBUMIN AGGREGATED
6.0000 | Freq: Once | INTRAVENOUS | Status: AC | PRN
  Administered 2015-03-11: 6.6 via INTRAVENOUS

## 2015-03-11 MED ORDER — TECHNETIUM TC 99M DIETHYLENETRIAME-PENTAACETIC ACID: 41.8000 | Freq: Once | INTRAVENOUS | Status: DC | PRN

## 2015-03-12 ENCOUNTER — Telehealth: Payer: Self-pay | Admitting: Physician Assistant

## 2015-03-12 ENCOUNTER — Telehealth: Payer: Self-pay | Admitting: Cardiology

## 2015-03-12 ENCOUNTER — Telehealth (HOSPITAL_COMMUNITY): Payer: Self-pay | Admitting: *Deleted

## 2015-03-12 NOTE — Telephone Encounter (Signed)
I cb pt who wanted to stress to me his frustration about all these test and was it really necessary to cont. with myoview since other testing has been ok so far. I explained to pt that myoview will help dr see if there may be a blockage. The pt tells me that he would like to really take the time to thank Tereso Newcomer, PA and I for all of the help we have given to him and taking the time to explain things to him as to why something  Is being done or ordered. I thanked pt for his very kind words and that I sure would Bing Neighbors. PA know of our conversation today. Pt said he will see [place] for the myoview. Pt again said thank you.

## 2015-03-12 NOTE — Telephone Encounter (Signed)
[address] [place], [place]   03/12/2015 5:54 PM

## 2015-03-12 NOTE — Telephone Encounter (Signed)
New message  ° ° ° °Patient calling stating someone called him today.  °

## 2015-03-12 NOTE — Telephone Encounter (Signed)
Patient given detailed instructions per Myocardial Perfusion Study Information Sheet for test on 03/14/15 at 0900. Patient notified to arrive 15 minutes early and that it is imperative to arrive on time for appointment to keep from having the test rescheduled.  If you need to cancel or reschedule your appointment, please call the office within 24 hours of your appointment. Failure to do so may result in a cancellation of your appointment, and a $50 no show fee. Patient verbalized understanding. Hasspacher, Adelene Idler

## 2015-03-12 NOTE — Telephone Encounter (Signed)
Pt made aware of results from yesterdays test 9/26.  Pt stated that he will come for his Echo on 9/29 and will decide after that if he wants to have the Myoview done, as he is not sure he wants to pay any more medical cost.  Pt knows he has appt with Dr. Mayford Knife on Monday and plans to attend.

## 2015-03-12 NOTE — Telephone Encounter (Signed)
New message      Pt request to talk to Okey Regal only---he would not tell me what he wanted except ---Okey Regal only

## 2015-03-14 ENCOUNTER — Ambulatory Visit (HOSPITAL_BASED_OUTPATIENT_CLINIC_OR_DEPARTMENT_OTHER): Payer: Medicare Other

## 2015-03-14 ENCOUNTER — Other Ambulatory Visit: Payer: Self-pay

## 2015-03-14 DIAGNOSIS — R079 Chest pain, unspecified: Secondary | ICD-10-CM

## 2015-03-14 DIAGNOSIS — R0602 Shortness of breath: Secondary | ICD-10-CM | POA: Diagnosis not present

## 2015-03-14 DIAGNOSIS — R0609 Other forms of dyspnea: Secondary | ICD-10-CM | POA: Diagnosis not present

## 2015-03-14 MED ORDER — PERFLUTREN LIPID MICROSPHERE
1.0000 mL | Freq: Once | INTRAVENOUS | Status: AC
  Administered 2015-03-14: 1 mL via INTRAVENOUS

## 2015-03-14 MED ORDER — REGADENOSON 0.4 MG/5ML IV SOLN
0.4000 mg | Freq: Once | INTRAVENOUS | Status: AC
  Administered 2015-03-14: 0.4 mg via INTRAVENOUS

## 2015-03-14 MED ORDER — TECHNETIUM TC 99M SESTAMIBI GENERIC - CARDIOLITE
33.0000 | Freq: Once | INTRAVENOUS | Status: AC | PRN
  Administered 2015-03-14: 33 via INTRAVENOUS

## 2015-03-15 ENCOUNTER — Telehealth: Payer: Self-pay | Admitting: *Deleted

## 2015-03-15 ENCOUNTER — Encounter: Payer: Self-pay | Admitting: Physician Assistant

## 2015-03-15 ENCOUNTER — Ambulatory Visit (HOSPITAL_COMMUNITY): Payer: Medicare Other | Attending: Cardiovascular Disease

## 2015-03-15 DIAGNOSIS — R0989 Other specified symptoms and signs involving the circulatory and respiratory systems: Secondary | ICD-10-CM

## 2015-03-15 LAB — MYOCARDIAL PERFUSION IMAGING
CHL CUP NUCLEAR SRS: 11
CHL CUP NUCLEAR SSS: 12
CHL CUP RESTING HR STRESS: 61 {beats}/min
CSEPPHR: 70 {beats}/min
LV dias vol: 93 mL
LVSYSVOL: 40 mL
RATE: 0.39
SDS: 1
TID: 0.85

## 2015-03-15 MED ORDER — TECHNETIUM TC 99M SESTAMIBI GENERIC - CARDIOLITE
31.5000 | Freq: Once | INTRAVENOUS | Status: AC | PRN
  Administered 2015-03-15: 32 via INTRAVENOUS

## 2015-03-15 NOTE — Telephone Encounter (Signed)
Pt notfied of echo results by phone with verbal understanding.

## 2015-03-17 ENCOUNTER — Encounter: Payer: Self-pay | Admitting: Physician Assistant

## 2015-03-18 ENCOUNTER — Encounter: Payer: Self-pay | Admitting: Cardiology

## 2015-03-18 ENCOUNTER — Ambulatory Visit (INDEPENDENT_AMBULATORY_CARE_PROVIDER_SITE_OTHER): Payer: Medicare Other | Admitting: Cardiology

## 2015-03-18 ENCOUNTER — Telehealth: Payer: Self-pay | Admitting: *Deleted

## 2015-03-18 VITALS — BP 120/76 | HR 85 | Ht 72.0 in | Wt 282.0 lb

## 2015-03-18 DIAGNOSIS — G4730 Sleep apnea, unspecified: Secondary | ICD-10-CM

## 2015-03-18 DIAGNOSIS — I442 Atrioventricular block, complete: Secondary | ICD-10-CM

## 2015-03-18 DIAGNOSIS — I10 Essential (primary) hypertension: Secondary | ICD-10-CM | POA: Diagnosis not present

## 2015-03-18 DIAGNOSIS — I2583 Coronary atherosclerosis due to lipid rich plaque: Principal | ICD-10-CM

## 2015-03-18 DIAGNOSIS — R0609 Other forms of dyspnea: Secondary | ICD-10-CM

## 2015-03-18 DIAGNOSIS — I2510 Atherosclerotic heart disease of native coronary artery without angina pectoris: Secondary | ICD-10-CM | POA: Diagnosis not present

## 2015-03-18 MED ORDER — DILTIAZEM HCL ER COATED BEADS 180 MG PO CP24: 180.0000 mg | ORAL_CAPSULE | Freq: Every day | ORAL | Status: DC

## 2015-03-18 NOTE — Telephone Encounter (Signed)
Pt advised of myoview results by phone with verbal understanding

## 2015-03-18 NOTE — Patient Instructions (Signed)
Medication Instructions:  Your physician has recommended you make the following change in your medication:  1) STOP TOPROL 2) STOP AMLODIPINE 3) START CARDIZEM 180 mg daily  Labwork: None  Testing/Procedures: None  Follow-Up: Your physician recommends that you schedule a follow-up appointment in: 3 weeks with Troy Newcomer, PA.  Your physician wants you to follow-up in: 6 months with Dr. Mayford Knife. You will receive a reminder letter in the mail two months in advance. If you don't receive a letter, please call our office to schedule the follow-up appointment.   Any Other Special Instructions Will Be Listed Below (If Applicable).

## 2015-03-18 NOTE — Progress Notes (Addendum)
Cardiology Office Note   Date:  03/18/2015   ID:  [name], DOB 1946-04-24, MRN [id number]  PCP:  [name], MD    Chief Complaint  Patient presents with  . Coronary Artery Disease      History of Present Illness: [name] is a 69 y.o. 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 Patient was noted to be in CHB during an echo in 2/16 and subsequent underwent dual chamber PPM with Dr. Ladona Ridgel.   He recently saw our PA, Tereso Newcomer, and was complaining of sharp L sided chest pain for 4 days. It would only last seconds. It was not related to exertion or meals. Denied pleuritic chest pain or chest pain with lying supine. He had been more short of breath .He underwent nuclear stress test that showed no ischemia.   He is NYHA 3. Denies orthopnea, PND, edema. Denies syncope. No significant cough. He denies wheezing. He was placed on Breo by his PCP several mos ago with improved breathing. He is no longer taking this drug. He was recently placed on Toprol-XL  for ATach noted on his device check and sleep study. Tereso Newcomer, [place] then stopped his metoprolol in case it was causing SOB and started Cardizem.  He got confused and never stopped the amlodipine or metoprolol and took the Cardizem for a while and then stopped it.  He says that he is doing much better with his breathing.  He denies any chest pain or pressure.  He denies any LE edema.   Past Medical History  Diagnosis Date  . Neck abscess   . Diabetes mellitus   . Hypertension   . MRSA (methicillin resistant Staphylococcus aureus)   . Coronary artery disease cath 2006    nonobstructive ASCAD with 30% RCA  . Shortness of breath   . Sleep apnea     cpap  . Hypothyroidism   . Depression   . GERD (gastroesophageal reflux disease)   . Anxiety   . Hypertrophic cardiomyopathy (HCC)     a. Echo 9/16: Severe LVH of the septal wall and moderate LVH of  the posterior wall, mild focal basal septal hypertrophy (hypertrophic cardiomyopathy), vigorous LVF, EF 65-70%, anteroseptal HK, grade 1 diastolic dysfunction, MAC, there is no SAM, normal RV function  . History of nuclear stress test     a. Myoview 9/16: EF 57%, normal study    Past Surgical History  Procedure Laterality Date  . Back surgery    . Abscess drainage    . Amputation Right 02/22/2014    Procedure: AMPUTATION RAY RIGHT FOURTH AND FIFTH;  Surgeon: Toni Arthurs, MD;  Location: [facility] OR;  Service: Orthopedics;  Laterality: Right;  . Permanent pacemaker insertion N/A 08/10/2014    Procedure: PERMANENT PACEMAKER INSERTION;  Surgeon: Marinus Maw, MD;  Location: [facility] CATH LAB;  Service: Cardiovascular;  Laterality: N/A;     Current Outpatient Prescriptions  Medication Sig Dispense Refill  . acetaminophen (TYLENOL) 325 MG tablet Take 1-2 tablets (325-650 mg total) by mouth every 4 (four) hours as needed for mild pain.    Marland Kitchen amLODipine (NORVASC) 5 MG tablet Take 1 tablet by mouth daily.    Marland Kitchen aspirin EC 81 MG tablet Take 81 mg by mouth daily.    . B-D INS SYR ULTRAFINE 1CC/30G 30G X 1/2" 1 ML MISC  12  . diltiazem (CARDIZEM CD) 120 MG 24 hr capsule Take 1 capsule (120 mg total) by mouth daily. 30 capsule 11  . FLUoxetine (PROZAC) 10 MG capsule Take 10 mg by mouth daily.     . Fluticasone Furoate-Vilanterol 100-25 MCG/INH AEPB Inhale 1 puff into the lungs daily. 1 each 0  . furosemide (LASIX) 40 MG tablet Take 1 tablet (40 mg total) by mouth as directed. 40 MG DAILY FOR 3 DAYS THEN STOP UNLESS ADVISED BY CARDIOLOGY 30 tablet 2  . insulin regular (NOVOLIN R,HUMULIN R) 100 units/mL injection Inject 8 Units into the skin 2 (two) times daily before a meal.     . levothyroxine (SYNTHROID, LEVOTHROID) 200 MCG tablet Take 200 mcg by mouth daily.  7  . metoprolol succinate (TOPROL-XL) 25 MG 24 hr tablet Take 1 tablet by mouth daily.    . Multiple Vitamin (MULITIVITAMIN WITH MINERALS) TABS Take 1  tablet by mouth daily.    . nitroGLYCERIN (NITROSTAT) 0.4 MG SL tablet Place 0.4 mg under the tongue every 5 (five) minutes as needed for chest pain (max 3 pills daily).    . ONE TOUCH ULTRA TEST test strip   12  . pantoprazole (PROTONIX) 40 MG tablet TAKE 1 TABLET BY MOUTH DAILY. TAKE 30-60 MIN BEFORE FIRST MEAL OF THE DAY 30 tablet 0  . predniSONE (DELTASONE) 20 MG tablet Take 1 tablet (20 mg total) by mouth daily with breakfast. X 7 DAYS     No current facility-administered medications for this visit.    Allergies:   Penicillins    Social History:  The patient  reports that he quit smoking about 41 years ago. His smoking use included Cigarettes. He has a 5 pack-year smoking history. He has never used smokeless tobacco. He reports that he does not drink alcohol or use illicit drugs.   Family History:  The patient's family history includes Heart disease in his father and mother.    ROS:  Please see the history of present illness.   Otherwise, review of systems are positive for none.   All other systems are reviewed and negative.    PHYSICAL EXAM: VS:  BP 120/76 mmHg  Pulse 85  Ht 6' (1.829 m)  Wt 282 lb (127.914 kg)  BMI 38.24 kg/m2 , BMI Body mass index is 38.24 kg/(m^2). GEN: Well nourished, well developed, in no acute distress HEENT: normal Neck: no JVD, carotid bruits, or masses Cardiac: RRR; no  rubs, or gallops,no edema.  2/6 SM at RUSB radiating into carotid arteries and LLSB Respiratory:  clear to auscultation bilaterally, normal work of breathing GI: soft, nontender, nondistended, + BS MS: no deformity or atrophy Skin: warm and dry, no rash Neuro:  Strength and sensation are intact Psych: euthymic mood, full affect   EKG:  EKG is not ordered today.     Recent Labs: 03/04/2015: BUN 27*; Creatinine, Ser 2.03*; Hemoglobin 16.1; Platelets 144*; Potassium 4.4; Sodium 137 03/05/2015: Pro B Natriuretic peptide (BNP) 75.0    Lipid Panel    Component Value Date/Time    CHOL  07/26/2008 0110    108        ATP III CLASSIFICATION:  <200     mg/dL   Desirable  161-096  mg/dL   Borderline High  >=045    mg/dL   High          TRIG 409* 07/26/2008 0110   HDL 18* 07/26/2008 0110   CHOLHDL 6.0 07/26/2008 0110   VLDL  75* 07/26/2008 0110   LDLCALC  07/26/2008 0110    15        Total Cholesterol/HDL:CHD Risk Coronary Heart Disease Risk Table                     Men   Women  1/2 Average Risk   3.4   3.3  Average Risk       5.0   4.4  2 X Average Risk   9.6   7.1  3 X Average Risk  23.4   11.0        Use the calculated Patient Ratio above and the CHD Risk Table to determine the patient's CHD Risk.        ATP III CLASSIFICATION (LDL):  <100     mg/dL   Optimal  161-096  mg/dL   Near or Above                    Optimal  130-159  mg/dL   Borderline  045-409  mg/dL   High  >811     mg/dL   Very High      Wt Readings from Last 3 Encounters:  03/18/15 282 lb (127.914 kg)  03/14/15 279 lb (126.554 kg)  03/05/15 279 lb (126.554 kg)    ASSESSMENT AND PLAN:  1. Chest Pain: Etiology not clear. His symptoms are atypical for ischemia. He is AV paced on his ECG. He had no sig CAD in 2010.Nuclear stress test negative for ischemia, VQ scan low prob for PE and BNP normal. CP has resolved.  2. Dyspnea: On last OV his O2 sats droped to 84% with ambulation on RA. He does have O2 at home. It was felt his symptoms were a combination of volume excess and COPD exacerbation.He was given PRN lasix which improved his SOB.  Today his SOB has resolved and lowest O2 sat ambulating was 94%.    3. CAD: Non-obstructive by LHC in 2010. Doubt ischemic chest pain.   4. Chronic Kidney Disease: Recent Creatinine 2.03.   5. HTN: Controlled.   6. Atrial Tachycardia:I have told him to stop his BB and amlodipine and start Cardizem CD  daily.  7. OSA: He has a  CPAP titration pending.  8. S/p Pacemaker: Implanted 2/2 complete heart block. FU with EP as  planned.   9. Diabetes Mellitus: FU with PCP.   10. COPD   11.  Systolic murmur with HOCM on exam.  Continue CCB  Patient will followup with PA in 2-3 weeks and with me in 6 months  Current medicines are reviewed at length with the patient today.  The patient does not have concerns regarding medicines.  The following changes have been made:  no change  Labs/ tests ordered today: See above Assessment and Plan No orders of the defined types were placed in this encounter.     Disposition:   FU with me in 6 months  Signed, Quintella Reichert, MD  03/18/2015 1:46 PM    [facility] [address] [place], [place], [place]  [address] Phone: [phone]; Fax: [phone]

## 2015-04-01 ENCOUNTER — Other Ambulatory Visit: Payer: Self-pay | Admitting: Gastroenterology

## 2015-04-02 ENCOUNTER — Encounter: Payer: Self-pay | Admitting: Internal Medicine

## 2015-04-04 ENCOUNTER — Ambulatory Visit (HOSPITAL_COMMUNITY)
Admission: RE | Admit: 2015-04-04 | Discharge: 2015-04-04 | Disposition: A | Discharge status: Home / Self Care | Payer: Medicare Other | Source: Ambulatory Visit | Attending: Cardiology | Admitting: Cardiology

## 2015-04-04 ENCOUNTER — Telehealth: Payer: Self-pay | Admitting: *Deleted

## 2015-04-04 DIAGNOSIS — I10 Essential (primary) hypertension: Secondary | ICD-10-CM | POA: Diagnosis not present

## 2015-04-04 DIAGNOSIS — R0609 Other forms of dyspnea: Secondary | ICD-10-CM

## 2015-04-04 DIAGNOSIS — E119 Type 2 diabetes mellitus without complications: Secondary | ICD-10-CM | POA: Diagnosis not present

## 2015-04-04 DIAGNOSIS — Z87891 Personal history of nicotine dependence: Secondary | ICD-10-CM | POA: Insufficient documentation

## 2015-04-04 NOTE — Telephone Encounter (Signed)
Pt notified of LE Venous doppler results by phone with verbal understanding no DVT. Pt verified his appt 10/24 2:20 with Bing NeighborsScott W. PA with me.

## 2015-04-07 NOTE — Progress Notes (Signed)
Cardiology Office Note   Date:  04/08/2015   ID:  [name], DOB [birth year], MRN [id number]  PCP:  Troy Fillers, MD  Cardiologist:  Dr. Armanda Magic   Electrophysiologist:  Dr. Lewayne Bunting   Chief Complaint  Patient presents with  . Follow-up    chest pain  . Coronary Artery Disease     History of Present Illness: [name] is a 69 y.o. 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  Patient was noted to be in CHB during an echo in 2/16 and subsequently underwent dual chamber PPM with Dr. Ladona Ridgel.    Patient went to the ED in 9/16 with chest pain and SOB but left before seeing the MD.  CXR was unremarkable.  One POC Troponin was neg.   I saw him in follow-up the next day. He had recently been placed on Toprol for atrial tachycardia noted on his device. He was noted to be hypoxic. I suspected that he had volume excess in setting of COPD exacerbation. VQ was low probability for PE. Venous duplex was negative for DVT. Chest x-ray demonstrated atelectasis. I adjusted his diuretics, stopped his beta blocker and placed him on calcium channel blocker. I placed him back on his steroid inhaler and had him follow-up with primary care. I had him resume O2 therapy. Nuclear stress test demonstrated normal perfusion. Echocardiogram demonstrated severe LVH, mild diastolic dysfunction and normal LV function. He saw Dr. Mayford Knife in follow-up 03/18/15. He was feeling better. O2 sats were improved. Patient had been confused and was still taking beta blocker, amlodipine and diltiazem. Beta blocker and amlodipine were stopped and he was asked to continue diltiazem.   He returns for follow-up.  Continues to do well. He denies chest pain. Denies orthopnea, PND or edema. He does get short of breath with more moderate activities. He is NYHA 2b. Breathing is still improved compared to previous. Denies syncope or near-syncope. He does feel clammy after I entered the  room consistent with hypoglycemia. Glucose was checked and was 72. We gave him crackers, soda and mint candies. He is feeling much better prior to leaving the office.   Studies/Reports Reviewed Today:  Venous Duplex 04/04/15 No DVT  Myoview 03/15/15 Myocardial perfusion is normal. The study is normal. This is a low risk study. Overall left ventricular systolic function was normal. LV cavity size is normal. Nuclear stress EF: 57%. The left ventricular ejection fraction is normal (55-65%). There are no significant changes in comparison to the prior study.  Echo 03/14/15 Severe LVH of the septal wall and moderate LVH of the posterior wall, mild focal basal septal hypertrophy, EF 65-70%, anteroseptal HK due to RV pacing or ischemia, grade 1 diastolic dysfunction, MAC  Dg Chest 2 View   03/11/2015    IMPRESSION: Bibasilar atelectasis increased on the left as compared to the previous study. There is no alveolar pneumonia. If the patient's clinical symptoms and history suggest episodes of pulmonary embolism, chest CT scanning would be a useful next imaging step. Electronically Signed   By: David  [place] M.D.   On: 03/11/2015 12:50   Nm Pulmonary Perf And Vent  03/11/2015    IMPRESSION: Small, subsegmental matching perfusion defects at the lung bases. Low probability ventilation perfusion lung scan for pulmonary embolism. Electronically Signed   By: Rudie Meyer M.D.   On: 03/11/2015 12:57   Dg Chest 2 View  03/04/2015    IMPRESSION: No  acute findings.  Stable bibasilar scarring/atelectasis.   Electronically Signed   By: Elberta Fortis M.D.   On: 03/04/2015 14:58    Echo 08/06/14 Severe LVH, EF 55-60%, mild LAE  Holter 07/26/14 Mobitz 2 with junctional escape  Myoview 05/2013 Low risk stress nuclear study with a fixed defect in the inferolateral wall c/w diaphragmatic attenuation.  LV Ejection Fraction: Study not gated  LHC 07/2008 LM:  Ok LAD:  Mid 25% LCx:  AV groove 25%, PLB prox 30% RCA:   Luminal irregs EF 65%   Past Medical History  Diagnosis Date  . Neck abscess   . Diabetes mellitus   . Hypertension   . MRSA (methicillin resistant Staphylococcus aureus)   . Coronary artery disease cath 2006    nonobstructive ASCAD with 30% RCA  . Shortness of breath   . Sleep apnea     cpap  . Hypothyroidism   . Depression   . GERD (gastroesophageal reflux disease)   . Anxiety   . Hypertrophic cardiomyopathy (HCC)     a. Echo 9/16: Severe LVH of the septal wall and moderate LVH of the posterior wall, mild focal basal septal hypertrophy (hypertrophic cardiomyopathy), vigorous LVF, EF 65-70%, anteroseptal HK, grade 1 diastolic dysfunction, MAC, there is no SAM, normal RV function  . History of nuclear stress test     a. Myoview 9/16: EF 57%, normal study    Past Surgical History  Procedure Laterality Date  . Back surgery    . Abscess drainage    . Amputation Right 02/22/2014    Procedure: AMPUTATION RAY RIGHT FOURTH AND FIFTH;  Surgeon: Toni Arthurs, MD;  Location: [facility] OR;  Service: Orthopedics;  Laterality: Right;  . Permanent pacemaker insertion N/A 08/10/2014    Procedure: PERMANENT PACEMAKER INSERTION;  Surgeon: Marinus Maw, MD;  Location: [facility] CATH LAB;  Service: Cardiovascular;  Laterality: N/A;     Current Outpatient Prescriptions  Medication Sig Dispense Refill  . acetaminophen (TYLENOL) 325 MG tablet Take 1-2 tablets (325-650 mg total) by mouth every 4 (four) hours as needed for mild pain.    Marland Kitchen aspirin EC 81 MG tablet Take 81 mg by mouth daily.    . B-D INS SYR ULTRAFINE 1CC/30G 30G X 1/2" 1 ML MISC   12  . diltiazem (CARDIZEM CD) 180 MG 24 hr capsule Take 1 capsule (180 mg total) by mouth daily. 30 capsule 6  . FLUoxetine (PROZAC) 10 MG capsule Take 10 mg by mouth daily.     . Fluticasone Furoate-Vilanterol 100-25 MCG/INH AEPB Inhale 1 puff into the lungs daily. 1 each 0  . furosemide (LASIX) 40 MG tablet Take 1 tablet (40 mg total) by mouth as directed. 40 MG DAILY  FOR 3 DAYS THEN STOP UNLESS ADVISED BY CARDIOLOGY 30 tablet 2  . insulin NPH Human (HUMULIN N,NOVOLIN N) 100 UNIT/ML injection Inject 20-30 Units into the skin 2 (two) times daily before a meal.    . insulin regular (NOVOLIN R,HUMULIN R) 100 units/mL injection Inject 10-15 Units into the skin 2 (two) times daily before a meal.     . levothyroxine (SYNTHROID, LEVOTHROID) 200 MCG tablet Take 200 mcg by mouth daily.  7  . Multiple Vitamin (MULITIVITAMIN WITH MINERALS) TABS Take 1 tablet by mouth daily.    . nitroGLYCERIN (NITROSTAT) 0.4 MG SL tablet Place 0.4 mg under the tongue every 5 (five) minutes as needed for chest pain (max 3 pills daily).    . ONE TOUCH ULTRA TEST test strip  12  . pantoprazole (PROTONIX) 40 MG tablet TAKE 1 TABLET BY MOUTH DAILY. TAKE 30-60 MIN BEFORE FIRST MEAL OF THE DAY 30 tablet 0  . predniSONE (DELTASONE) 20 MG tablet Take 1 tablet (20 mg total) by mouth daily with breakfast. X 7 DAYS     No current facility-administered medications for this visit.    Allergies:   Penicillins    Social History:  The patient  reports that he quit smoking about 41 years ago. His smoking use included Cigarettes. He has a 5 pack-year smoking history. He has never used smokeless tobacco. He reports that he does not drink alcohol or use illicit drugs.   Family History:  The patient's family history includes Heart disease in his father and mother.    ROS:   Please see the history of present illness.   Review of Systems  Constitution: Positive for chills.  Respiratory: Positive for cough and shortness of breath.   Neurological: Positive for dizziness.  Psychiatric/Behavioral: Positive for depression.  All other systems reviewed and are negative.     PHYSICAL EXAM: VS:  BP 128/70 mmHg  Pulse 66  Ht 6' (1.829 m)  Wt 287 lb 6.4 oz (130.364 kg)  BMI 38.97 kg/m2  SpO2 94%    Wt Readings from Last 3 Encounters:  04/08/15 287 lb 6.4 oz (130.364 kg)  03/18/15 282 lb (127.914 kg)   03/14/15 279 lb (126.554 kg)     GEN: Well nourished, well developed, in no acute distress HEENT: normal Neck: no JVD,  no masses Cardiac:  Normal S1/S2, RRR; no murmur ,  no rubs or gallops, no edema   Respiratory: decreased breath sounds bilaterally, no wheezing, rhonchi or rales. GI: soft, nontender, nondistended, + BS MS: no deformity or atrophy Skin: warm and dry  Neuro:  CNs II-XII intact, Strength and sensation are intact Psych: Normal affect   EKG:  EKG is ordered today.  It demonstrates:   AV paced, HR 66   Recent Labs: 03/04/2015: BUN 27*; Creatinine, Ser 2.03*; Hemoglobin 16.1; Platelets 144*; Potassium 4.4; Sodium 137 03/05/2015: Pro B Natriuretic peptide (BNP) 75.0    Lipid Panel    Component Value Date/Time   CHOL  07/26/2008 0110    108        ATP III CLASSIFICATION:  <200     mg/dL   Desirable  161-096  mg/dL   Borderline High  >=045    mg/dL   High          TRIG 409* 07/26/2008 0110   HDL 18* 07/26/2008 0110   CHOLHDL 6.0 07/26/2008 0110   VLDL 75* 07/26/2008 0110   LDLCALC  07/26/2008 0110    15        Total Cholesterol/HDL:CHD Risk Coronary Heart Disease Risk Table                     Men   Women  1/2 Average Risk   3.4   3.3  Average Risk       5.0   4.4  2 X Average Risk   9.6   7.1  3 X Average Risk  23.4   11.0        Use the calculated Patient Ratio above and the CHD Risk Table to determine the patient's CHD Risk.        ATP III CLASSIFICATION (LDL):  <100     mg/dL   Optimal  811-914  mg/dL   Near or Above  Optimal  130-159  mg/dL   Borderline  161-096160-189  mg/dL   High  >045>190     mg/dL   Very High      ASSESSMENT AND PLAN:  1. CAD:  Non-obstructive by LHC in 2010. Recently seen for Chest Pain. Myoview was low risk.  VQ was low probability.  He was treated for a/c diastolic CHF in the setting of AECOPD.  He is now feeling better.   2. Chronic Kidney Disease:  Recent Creatinine 2.03.    3. Chronic Diastolic CHF:   Recent Echo demonstrated LVH consistent with hypertrophic CM.  No LVOT obstruction.  Volume appears stable.  He only takes Lasix prn.    4. HTN:  BP elevated today.  But his sugar was dropping and his BPs at home have been optimal at home. Repeat BP by me 128/70. Continue current therapy.    5. Atrial Tachycardia:  Continue Diltiazem.  Denies palpitations.    6. OSA:   Continue CPAP.   7. S/p Pacemaker:  Implanted 2/2 complete heart block. FU with EP as planned.    8. Diabetes Mellitus:  He felt hypoglycemic upon my entering the room.  He was given some mint candies, sprite and crackers.  Glucose was 72.  He is much improved upon leaving the office.    9. COPD:  Continue Breo.          Medication Changes: Current medicines are reviewed at length with the patient today.  Concerns regarding medicines are as outlined above.  The following changes have been made:   Discontinued Medications   No medications on file   Modified Medications   No medications on file   New Prescriptions   No medications on file   Labs/ tests ordered today include:   Orders Placed This Encounter  Procedures  . EKG 12-Lead     Disposition:    FU with  Dr. Mayford Knifeurner as planned    Signed, Brynda RimScott Madason Rauls, PA-C, MHS 04/08/2015 3:19 PM    [facility] [address], [place], [place]  [address] Phone: [phone]; Fax: [phone]

## 2015-04-08 ENCOUNTER — Encounter: Payer: Self-pay | Admitting: Physician Assistant

## 2015-04-08 ENCOUNTER — Ambulatory Visit (INDEPENDENT_AMBULATORY_CARE_PROVIDER_SITE_OTHER): Payer: Medicare Other | Admitting: Physician Assistant

## 2015-04-08 VITALS — BP 128/70 | HR 66 | Ht 72.0 in | Wt 287.4 lb

## 2015-04-08 DIAGNOSIS — N183 Chronic kidney disease, stage 3 (moderate): Secondary | ICD-10-CM | POA: Diagnosis not present

## 2015-04-08 DIAGNOSIS — J449 Chronic obstructive pulmonary disease, unspecified: Secondary | ICD-10-CM | POA: Diagnosis not present

## 2015-04-08 DIAGNOSIS — I2510 Atherosclerotic heart disease of native coronary artery without angina pectoris: Secondary | ICD-10-CM

## 2015-04-08 DIAGNOSIS — G4733 Obstructive sleep apnea (adult) (pediatric): Secondary | ICD-10-CM

## 2015-04-08 DIAGNOSIS — I5032 Chronic diastolic (congestive) heart failure: Secondary | ICD-10-CM | POA: Diagnosis not present

## 2015-04-08 DIAGNOSIS — I471 Supraventricular tachycardia: Secondary | ICD-10-CM

## 2015-04-08 NOTE — Patient Instructions (Signed)
Medication Instructions:  Your physician recommends that you continue on your current medications as directed. Please refer to the Current Medication list given to you today.   Labwork: NONE  Testing/Procedures: NONE  Follow-Up: WE WILL SEND OUT A REMINDER LETTER NEXT JAN OR FEB 2017 TO CALL AND MAKE AN APPT WITH DR. Mayford KnifeURNER  Any Other Special Instructions Will Be Listed Below (If Applicable).   If you need a refill on your cardiac medications before your next appointment, please call your pharmacy.

## 2015-04-12 ENCOUNTER — Ambulatory Visit (HOSPITAL_BASED_OUTPATIENT_CLINIC_OR_DEPARTMENT_OTHER): Payer: Medicare Other | Attending: Cardiology

## 2015-04-12 DIAGNOSIS — R0683 Snoring: Secondary | ICD-10-CM | POA: Insufficient documentation

## 2015-04-12 DIAGNOSIS — G4733 Obstructive sleep apnea (adult) (pediatric): Secondary | ICD-10-CM | POA: Diagnosis not present

## 2015-04-12 DIAGNOSIS — G4730 Sleep apnea, unspecified: Secondary | ICD-10-CM | POA: Diagnosis present

## 2015-04-14 ENCOUNTER — Telehealth: Payer: Self-pay | Admitting: Cardiology

## 2015-04-14 NOTE — Sleep Study (Signed)
   Patient Name: [name], [name] MRN: [id number] Study Date: 04/12/2015 Gender: Male D.O.B: [birth year] Age (years): [age] Referring Provider: Armanda Magicraci Naylin Burkle MD, ABSM Interpreting Physician: Armanda Magicraci Nasreen Goedecke MD, ABSM RPSGT: Melburn PopperWillard, Susan  Weight (lbs): 287 Height (inches): 72 BMI: 39 Neck Size: 18.00  CLINICAL INFORMATION The patient is referred for a BiPAP titration to treat sleep apnea. Date of NPSG, Split Night or HST:01/14/2015  SLEEP STUDY TECHNIQUE As per the AASM Manual for the Scoring of Sleep and Associated Events v2.3 (April 2016) with a hypopnea requiring 4% desaturations. The channels recorded and monitored were frontal, central and occipital EEG, electrooculogram (EOG), submentalis EMG (chin), nasal and oral airflow, thoracic and abdominal wall motion, anterior tibialis EMG, snore microphone, electrocardiogram, and pulse oximetry. Bilevel positive airway pressure (BPAP) was initiated at the beginning of the study and titrated to treat sleep-disordered breathing.  MEDICATIONS Medications administered by patient during sleep study : No sleep medicine administered. Home medications: ASA, Cardizem, Prozac, Lasix, Insulin, Synthroid, Protonix.  RESPIRATORY PARAMETERS Optimal IPAP Pressure (cm): 19 AHI at Optimal Pressure (/hr) 3.2 Optimal EPAP Pressure (cm):15   Overall Minimal O2 (%):86.00  Minimal O2 at Optimal Pressure (%):90.0  SLEEP ARCHITECTURE Start Time:10:58:31 PM Stop Time:5:01:15 AM Total Time (min):362.7 Total Sleep Time (min):281.2 Sleep Latency (min):18.6 Sleep Efficiency (%):77.5 REM Latency (min):9.5 WASO (min):63.0 Stage N1 (%): 12.80  Stage N2 (%): 61.59  Stage N3 (%):0.00  Stage R (%):25.61 Supine (%):38.19  Arousal Index (/hr):36.1      CARDIAC DATA The 2 lead EKG demonstrated sinus rhythm. The mean heart rate was 61.70 beats per minute. Other EKG findings include: None.  LEG MOVEMENT DATA The total Periodic Limb Movements of Sleep (PLMS) were 332. The  PLMS index was 70.85. A PLMS index of <15 is considered normal in adults.  IMPRESSIONS - An optimal PAP pressure was selected for this patient of 19/15cm of water. - Central sleep apnea was not noted during this titration (CAI = 0.0/h). - Moderate oxygen desaturations were observed during this titration (min O2 = 86.00%). - The patient snored with Soft snoring volume. - No cardiac abnormalities were observed during this study. 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 Arousals associated with PLMs were significant.  DIAGNOSIS - Obstructive Sleep Apnea (327.23 [G47.33 ICD-10])  RECOMMENDATIONS - Trial of BiPAP therapy on 19/15 cm H2O with a Large size Resmed Full Face Mask Mirage Quattro mask and heated humidification. - Avoid alcohol, sedatives and other CNS depressants that may worsen sleep apnea and disrupt normal sleep architecture. - Sleep hygiene should be reviewed to assess factors that may improve sleep quality. - Weight management and regular exercise should be initiated or continued. - Return to Sleep Center for re-evaluation after 10 weeks of therapy    Quintella ReichertURNER,Beryle Zeitz R Diplomate, American Board of Sleep Medicine  ELECTRONICALLY SIGNED ON:  04/14/2015, 10:06 PM [facility] PH: [phone]   FX: [phone] ACCREDITED BY THE AMERICAN ACADEMY OF SLEEP MEDICINE

## 2015-04-14 NOTE — Addendum Note (Signed)
Addended by: Armanda MagicURNER, Thiago Ragsdale R on: 04/14/2015 10:12 PM   Modules accepted: Orders

## 2015-04-14 NOTE — Telephone Encounter (Signed)
Pt had successful PAP titration. Please setup appointment in 10 weeks. Please let [facility] know that order for PAP is in EPIC.   

## 2015-04-15 ENCOUNTER — Other Ambulatory Visit: Payer: Self-pay | Admitting: Internal Medicine

## 2015-04-15 NOTE — Telephone Encounter (Signed)
Spoke with patient about results. Stated verbal understanding. [facility] notified of orders.  Once patient receives machine, I will schedule 10 week follow-up.

## 2015-04-17 ENCOUNTER — Other Ambulatory Visit: Payer: Self-pay | Admitting: Internal Medicine

## 2015-04-22 ENCOUNTER — Telehealth: Payer: Self-pay | Admitting: Physician Assistant

## 2015-04-22 NOTE — Telephone Encounter (Signed)
Patient put through from answering.  When I asked if I could help him he said that nothing was wrong but he just wanted to know what water pill he has.  I told him that it is Lasix.  He repeats it and I here someone on the phone say ,"I Told you so".  He says thank me and hangs up.

## 2015-04-22 NOTE — Telephone Encounter (Signed)
New Message   Pt c/o swelling: STAT is pt has developed SOB within 24 hours  1. How long have you been experiencing swelling? 04/21/15  2. Where is the swelling located? legs  3.  Are you currently taking a "fluid pill"? yes  4.  Are you currently SOB? Yes  5.  Have you traveled recently? no

## 2015-05-16 ENCOUNTER — Ambulatory Visit (INDEPENDENT_AMBULATORY_CARE_PROVIDER_SITE_OTHER): Payer: Medicare Other | Admitting: *Deleted

## 2015-05-16 DIAGNOSIS — I442 Atrioventricular block, complete: Secondary | ICD-10-CM

## 2015-05-17 NOTE — Progress Notes (Signed)
Remote pacemaker transmission.   

## 2015-05-24 LAB — CUP PACEART REMOTE DEVICE CHECK
Battery Remaining Longevity: 113 mo
Battery Remaining Percentage: 95.5 %
Brady Statistic AP VS Percent: 1 %
Brady Statistic AS VS Percent: 1 %
Brady Statistic RV Percent Paced: 99 %
Date Time Interrogation Session: 20161201073709
Implantable Lead Implant Date: 20160226
Implantable Lead Location: 753859
Lead Channel Impedance Value: 430 Ohm
Lead Channel Pacing Threshold Amplitude: 0.875 V
Lead Channel Pacing Threshold Pulse Width: 0.4 ms
Lead Channel Sensing Intrinsic Amplitude: 4.7 mV
Lead Channel Setting Pacing Amplitude: 1.125
Lead Channel Setting Pacing Pulse Width: 0.4 ms
Lead Channel Setting Sensing Sensitivity: 3.5 mV
MDC IDC LEAD IMPLANT DT: 20160226
MDC IDC LEAD LOCATION: 753860
MDC IDC MSMT BATTERY VOLTAGE: 2.99 V
MDC IDC MSMT LEADCHNL RA PACING THRESHOLD AMPLITUDE: 0.875 V
MDC IDC MSMT LEADCHNL RA PACING THRESHOLD PULSEWIDTH: 0.4 ms
MDC IDC MSMT LEADCHNL RV IMPEDANCE VALUE: 400 Ohm
MDC IDC MSMT LEADCHNL RV SENSING INTR AMPL: 11.2 mV
MDC IDC SET LEADCHNL RA PACING AMPLITUDE: 1.875
MDC IDC STAT BRADY AP VP PERCENT: 88 %
MDC IDC STAT BRADY AS VP PERCENT: 12 %
MDC IDC STAT BRADY RA PERCENT PACED: 88 %
Pulse Gen Model: 2240
Pulse Gen Serial Number: 7710764

## 2015-05-26 ENCOUNTER — Other Ambulatory Visit: Payer: Self-pay | Admitting: Internal Medicine

## 2015-05-28 ENCOUNTER — Encounter: Payer: Self-pay | Admitting: Cardiology

## 2015-06-30 ENCOUNTER — Other Ambulatory Visit: Payer: Self-pay | Admitting: Internal Medicine

## 2015-07-04 ENCOUNTER — Other Ambulatory Visit: Payer: Self-pay | Admitting: Internal Medicine

## 2015-08-30 IMAGING — CR DG CHEST 2V
2 series · 2 of 2 positions shown · non-contrast
Comparison: DG CHEST 2 VIEW dated 07/25/2008

CLINICAL DATA: Shortness of breath.

EXAM:
CHEST  2 VIEW

[view not recorded (1 of 2)]
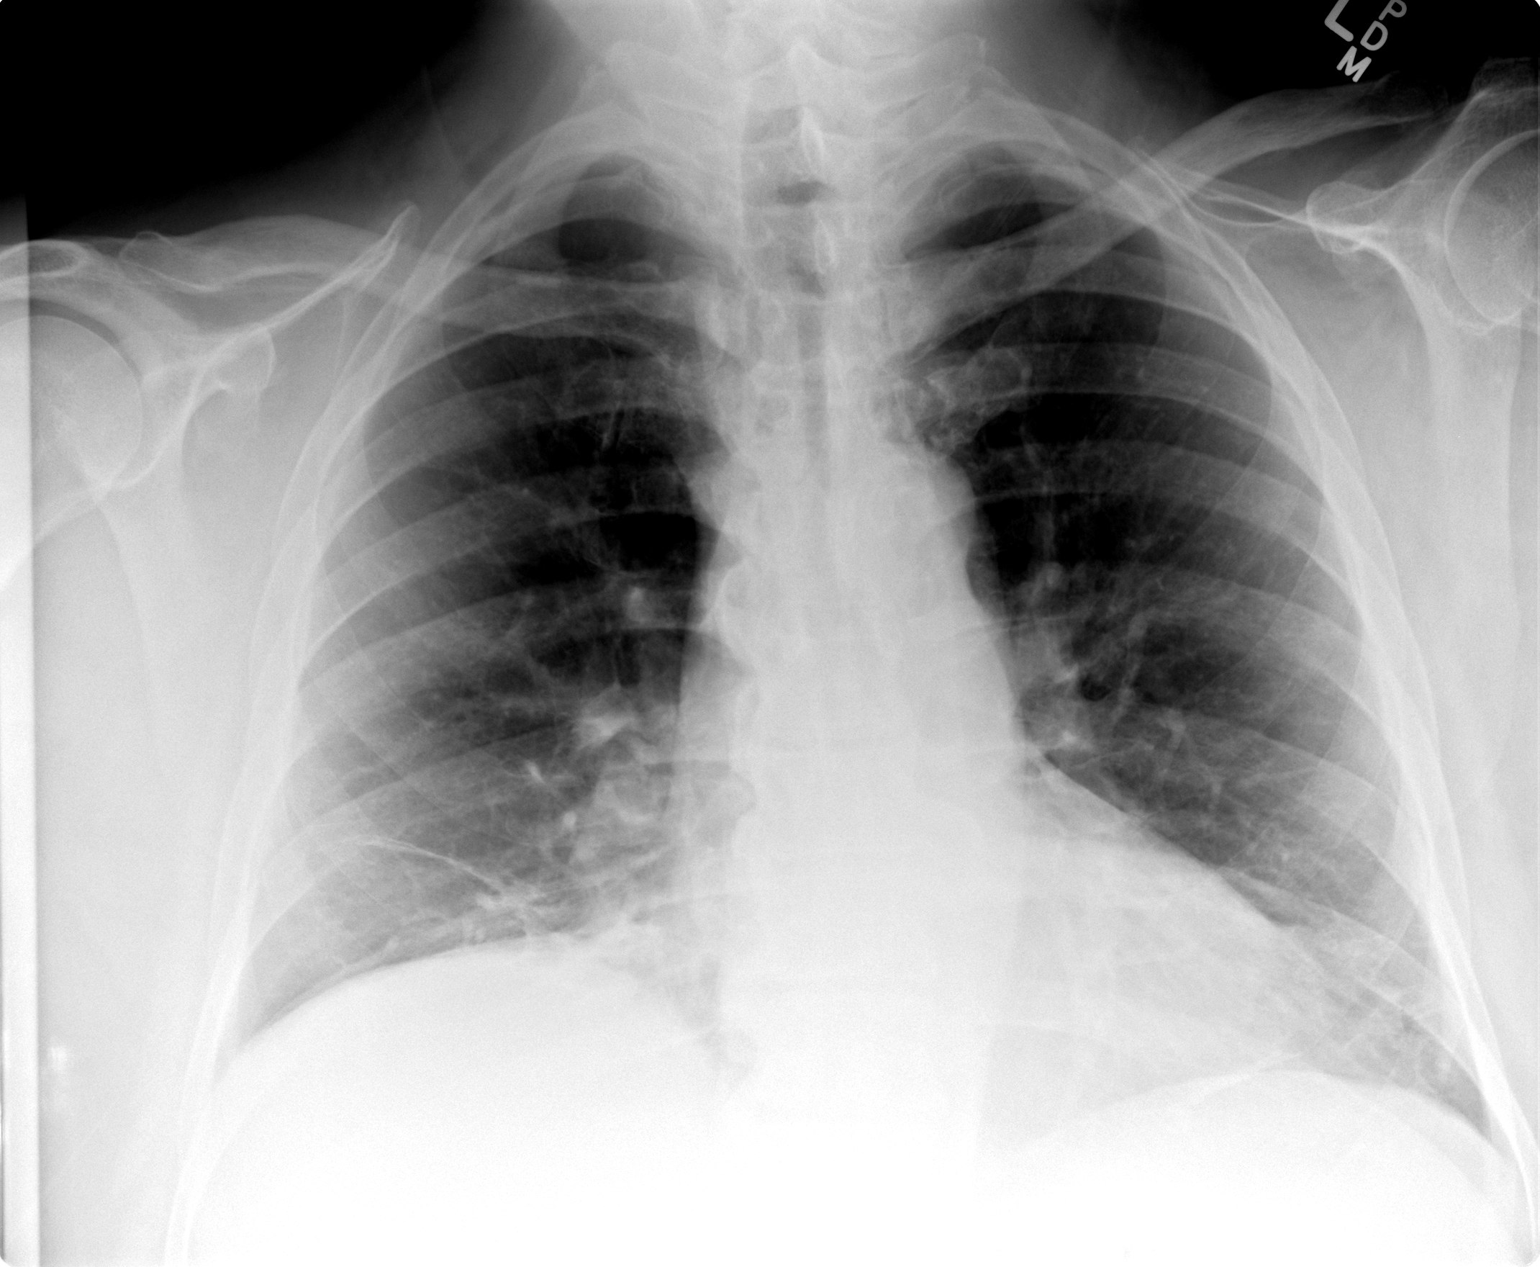

[view not recorded (2 of 2)]
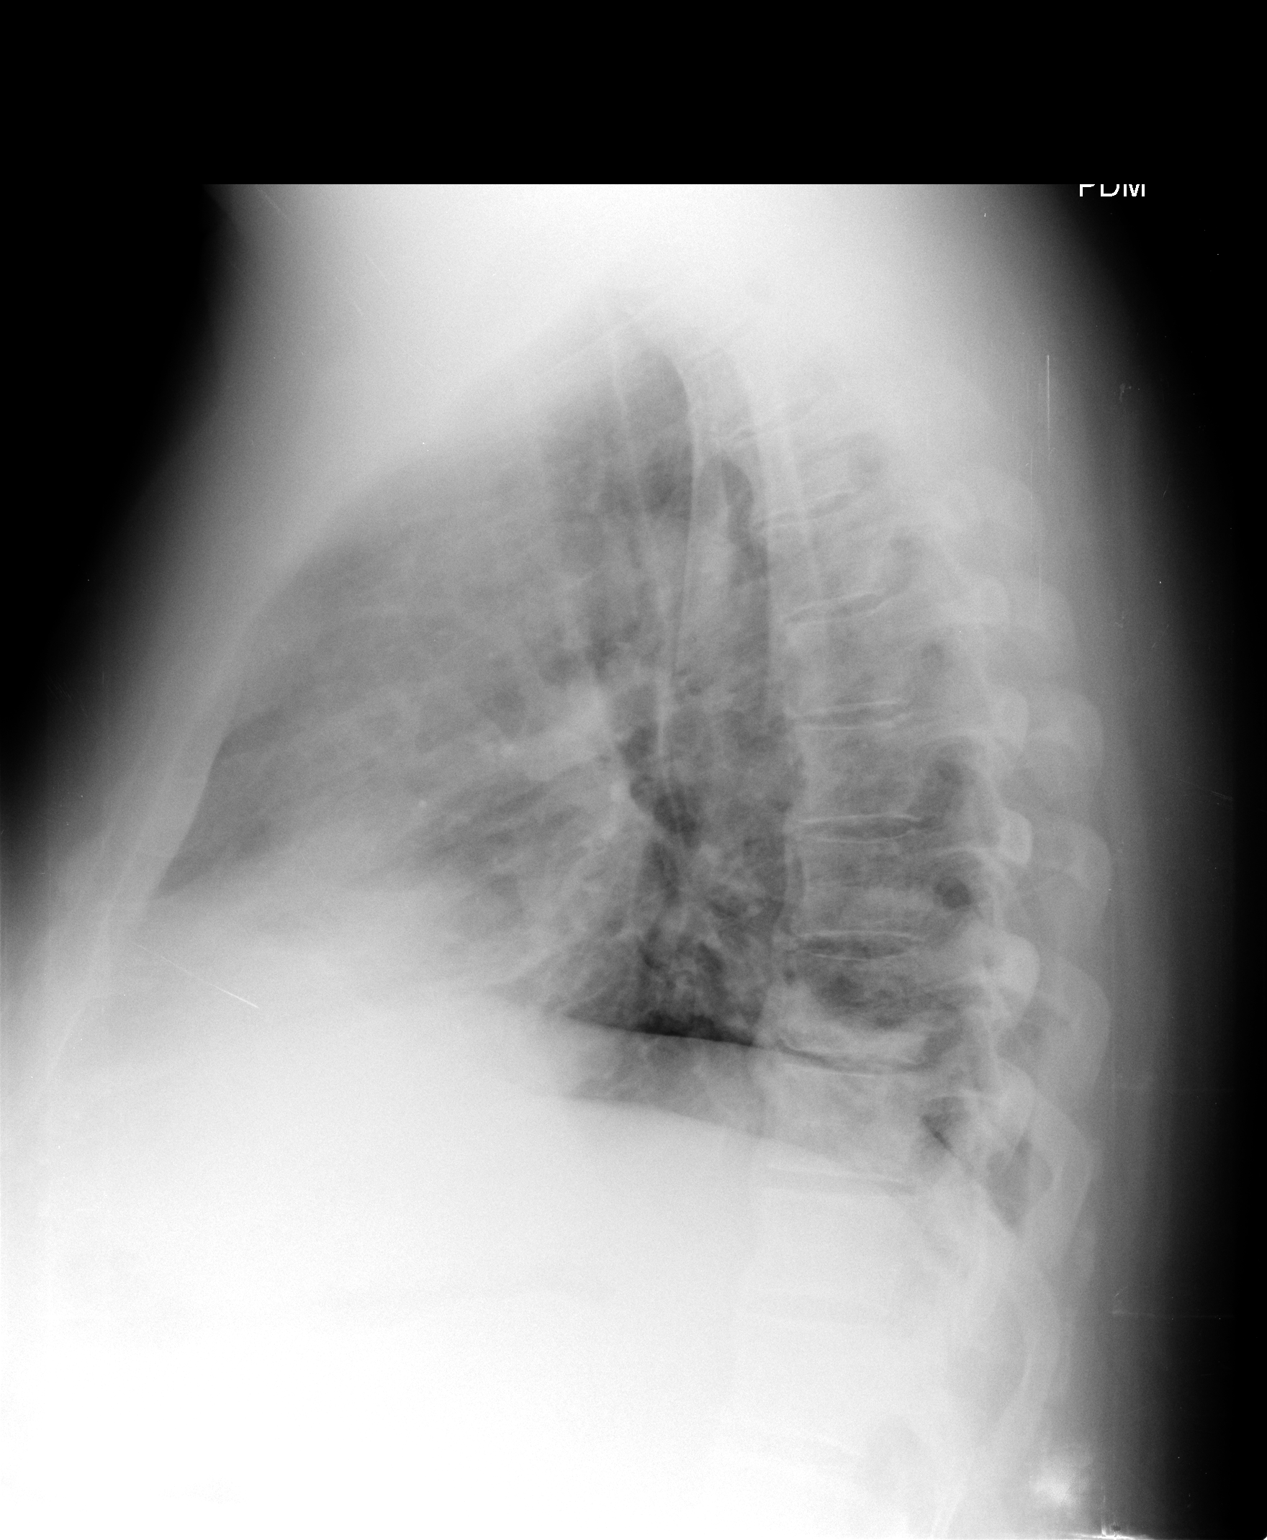

[2 of 2 positions shown; findings below may reference images not displayed]

FINDINGS: Trachea is midline. Heart size stable. Lungs are somewhat low in
volume with bibasilar atelectasis and/or scarring. No pleural fluid.
Flowing anterior osteophytosis in the thoracic spine.
IMPRESSION: Somewhat low lung volumes with bibasilar atelectasis and/or
scarring.

## 2015-09-13 ENCOUNTER — Encounter: Payer: Self-pay | Admitting: Internal Medicine

## 2015-09-13 ENCOUNTER — Ambulatory Visit (INDEPENDENT_AMBULATORY_CARE_PROVIDER_SITE_OTHER): Payer: Medicare Other | Admitting: Internal Medicine

## 2015-09-13 VITALS — BP 130/68 | HR 60 | Ht 72.0 in | Wt 267.6 lb

## 2015-09-13 DIAGNOSIS — I442 Atrioventricular block, complete: Secondary | ICD-10-CM

## 2015-09-13 NOTE — Progress Notes (Signed)
HPI [name] returns today for PPM followup. 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 12 months ago. He has done well in the interim except for a URI and a diarrhea illness from which he is just now recovering from. He denies chest pain or sob. Minimal edema. No fever or chills. His strength has just started to improve as has his appetite. He has lost 12 lbs. Allergies  Allergen Reactions  . Penicillins     REACTION: swollen tongue and eyes     Current Outpatient Prescriptions  Medication Sig Dispense Refill  . acetaminophen (TYLENOL) 325 MG tablet Take 1-2 tablets (325-650 mg total) by mouth every 4 (four) hours as needed for mild pain.    Marland Kitchen. aspirin EC 81 MG tablet Take 81 mg by mouth daily.    . B-D INS SYR ULTRAFINE 1CC/30G 30G X 1/2" 1 ML MISC   12  . diltiazem (CARDIZEM CD) 180 MG 24 hr capsule Take 1 capsule (180 mg total) by mouth daily. 30 capsule 6  . FLUoxetine (PROZAC) 10 MG capsule Take 10 mg by mouth daily.     . Fluticasone Furoate-Vilanterol 100-25 MCG/INH AEPB Inhale 1 puff into the lungs daily. 1 each 0  . furosemide (LASIX) 40 MG tablet Take 1 tablet (40 mg total) by mouth as directed. 40 MG DAILY FOR 3 DAYS THEN STOP UNLESS ADVISED BY CARDIOLOGY 30 tablet 2  . gabapentin (NEURONTIN) 300 MG capsule     . insulin NPH Human (HUMULIN N,NOVOLIN N) 100 UNIT/ML injection Inject 20-30 Units into the skin 2 (two) times daily before a meal.    . insulin regular (NOVOLIN R,HUMULIN R) 100 units/mL injection Inject 10-15 Units into the skin 2 (two) times daily before a meal.     . levothyroxine (SYNTHROID, LEVOTHROID) 200 MCG tablet Take 200 mcg by mouth daily.  7  . Multiple Vitamin (MULITIVITAMIN WITH MINERALS) TABS Take 1 tablet by mouth daily.    . nitroGLYCERIN (NITROSTAT) 0.4 MG SL tablet Place 0.4 mg under the tongue every 5 (five) minutes as needed for chest pain (max 3 pills daily).    . ONE TOUCH ULTRA TEST test strip   12  . pantoprazole  (PROTONIX) 40 MG tablet TAKE 1 TABLET BY MOUTH DAILY. TAKE 30-60 MIN BEFORE FIRST MEAL OF THE DAY 30 tablet 0  . predniSONE (DELTASONE) 20 MG tablet Take 1 tablet (20 mg total) by mouth daily with breakfast. X 7 DAYS     No current facility-administered medications for this visit.     Past Medical History  Diagnosis Date  . Neck abscess   . Diabetes mellitus   . Hypertension   . MRSA (methicillin resistant Staphylococcus aureus)   . Coronary artery disease cath 2006    nonobstructive ASCAD with 30% RCA  . Shortness of breath   . Sleep apnea     cpap  . Hypothyroidism   . Depression   . GERD (gastroesophageal reflux disease)   . Anxiety   . Hypertrophic cardiomyopathy (HCC)     a. Echo 9/16: Severe LVH of the septal wall and moderate LVH of the posterior wall, mild focal basal septal hypertrophy (hypertrophic cardiomyopathy), vigorous LVF, EF 65-70%, anteroseptal HK, grade 1 diastolic dysfunction, MAC, there is no SAM, normal RV function  . History of nuclear stress test     a. Myoview 9/16: EF 57%, normal study    ROS:   All systems reviewed and  negative except as noted in the HPI.   Past Surgical History  Procedure Laterality Date  . Back surgery    . Abscess drainage    . Amputation Right 02/22/2014    Procedure: AMPUTATION RAY RIGHT FOURTH AND FIFTH;  Surgeon: Toni Arthurs, MD;  Location: [facility] OR;  Service: Orthopedics;  Laterality: Right;  . Permanent pacemaker insertion N/A 08/10/2014    Procedure: PERMANENT PACEMAKER INSERTION;  Surgeon: Marinus Maw, MD;  Location: [facility] CATH LAB;  Service: Cardiovascular;  Laterality: N/A;     Family History  Problem Relation Age of Onset  . Heart disease Mother   . Heart disease Father      Social History   Social History  . Marital Status: Married    Spouse Name: N/A  . Number of Children: N/A  . Years of Education: N/A   Occupational History  . retired    Social History Main Topics  . Smoking status: Former Smoker  -- 0.50 packs/day for 10 years    Types: Cigarettes    Quit date: 06/15/1973  . Smokeless tobacco: Never Used  . Alcohol Use: No  . Drug Use: No  . Sexual Activity: Not on file   Other Topics Concern  . Not on file   Social History Narrative     BP 130/68 mmHg  Pulse 60  Ht 6' (1.829 m)  Wt 267 lb 9.6 oz (121.383 kg)  BMI 36.29 kg/m2  Physical Exam:  Well appearing 70 yo man, NAD HEENT: Unremarkable Neck:  6 cm JVD, no thyromegally Back:  No CVA tenderness Lungs:  Clear with no wheezes, well healed PPM incision. HEART:  Regular rate rhythm, no murmurs, no rubs, no clicks Abd:  soft, positive bowel sounds, no organomegally, no rebound, no guarding Ext:  2 plus pulses, no edema, no cyanosis, no clubbing Skin:  No rashes no nodules Neuro:  CN II through XII intact, motor grossly intact   DEVICE  Normal device function.  See PaceArt for details.   A/P 1. PPM - his St. Jude device is working normally. Will recheck in several months. 2. Complete heart block - he is asymptomatic after PPM 3. HTN - his blood pressure is improved. He is encouraged to reduce his salt intake. 4. Obesity - he has lost 12 lbs in the past month as he has been sick. I have encouraged him to try and lose more weight.   Keir Viernes,M.D.  Assess/Plan:

## 2015-09-13 NOTE — Patient Instructions (Signed)
Medication Instructions:  Your physician recommends that you continue on your current medications as directed. Please refer to the Current Medication list given to you today.   Labwork: None ordered   Testing/Procedures: None ordered   Follow-Up: Your physician wants you to follow-up in: 12 months with Dr Taylor You will receive a reminder letter in the mail two months in advance. If you don't receive a letter, please call our office to schedule the follow-up appointment.  Remote monitoring is used to monitor your Pacemaker  from home. This monitoring reduces the number of office visits required to check your device to one time per year. It allows us to keep an eye on the functioning of your device to ensure it is working properly. You are scheduled for a device check from home on 12/16/15. You may send your transmission at any time that day. If you have a wireless device, the transmission will be sent automatically. After your physician reviews your transmission, you will receive a postcard with your next transmission date.     Any Other Special Instructions Will Be Listed Below (If Applicable).     If you need a refill on your cardiac medications before your next appointment, please call your pharmacy.   

## 2015-09-16 ENCOUNTER — Other Ambulatory Visit: Payer: Self-pay | Admitting: Internal Medicine

## 2015-09-16 DIAGNOSIS — R7401 Elevation of levels of liver transaminase levels: Secondary | ICD-10-CM

## 2015-09-16 DIAGNOSIS — R740 Nonspecific elevation of levels of transaminase and lactic acid dehydrogenase [LDH]: Principal | ICD-10-CM

## 2015-09-23 ENCOUNTER — Ambulatory Visit
Admission: RE | Admit: 2015-09-23 | Discharge: 2015-09-23 | Disposition: A | Discharge status: Home / Self Care | Payer: Medicare Other | Source: Ambulatory Visit | Attending: Internal Medicine | Admitting: Internal Medicine

## 2015-09-23 DIAGNOSIS — R740 Nonspecific elevation of levels of transaminase and lactic acid dehydrogenase [LDH]: Principal | ICD-10-CM

## 2015-09-23 DIAGNOSIS — R7401 Elevation of levels of liver transaminase levels: Secondary | ICD-10-CM

## 2015-09-30 ENCOUNTER — Telehealth: Payer: Self-pay | Admitting: Cardiology

## 2015-09-30 DIAGNOSIS — I10 Essential (primary) hypertension: Secondary | ICD-10-CM

## 2015-09-30 DIAGNOSIS — R0602 Shortness of breath: Secondary | ICD-10-CM

## 2015-09-30 MED ORDER — FUROSEMIDE 40 MG PO TABS: 40.0000 mg | ORAL_TABLET | ORAL | Status: AC

## 2015-09-30 NOTE — Telephone Encounter (Signed)
Patient st he feels much better this afternoon than this morning. He reports his swelling is "so much better." Today was day 3 of taking Lasix (he was instructed to take 3 days' worth and to call cardiology if more was necessary). Patient st he doesn't take the Lasix "that often," but when he does, he has to take for all 3 days most of the time. The patient understands to continue daily weights and to call in weight increases. Offered patient follow-up visit for evaluation and he declined because he feels well. Lasix refills sent in per patient request.

## 2015-09-30 NOTE — Telephone Encounter (Signed)
[name] is calling because [name] is in the CHF program w/ [employer]. His weight has gone up to 3. 8 pounds in two days and he has taken his 40mg  Lasik on Friday , Saturday and Sunday . He is only supposed to take it 3 days in a row . Also he is experiencing a little more shortness of breath. Please call [name] at [phone].

## 2015-10-27 ENCOUNTER — Other Ambulatory Visit: Payer: Self-pay | Admitting: Cardiology

## 2015-11-05 ENCOUNTER — Telehealth: Payer: Self-pay | Admitting: Cardiology

## 2015-11-05 NOTE — Telephone Encounter (Signed)
New message      Calling to report findings to Dr Mayford Knifeurner: Troy FordyceWt gain 7lbs over last 5 days, sob, swelling in feet and ankles and more tired than usual.  Pt took lasix thurs-sat---none on sun--started back on Monday but noticed no significant changes.

## 2015-11-05 NOTE — Telephone Encounter (Signed)
Called to check on patient after [facility]. Patient st he "feels OK, but gets real short of breath when moving." He also complains of "real bad swelling" in his feet and legs.  He st he knows he is not supposed to take Lasix, but has been taking it for a few days to help with the fluid retention. He is worried because he's "fine when sitting, but scared about breathing sometimes when walking." The patient is not audibly SOB on the phone and confirmed with patient he is not in acute distress. Scheduled patient 5/25 with Tereso NewcomerScott Weaver for evaluation. Instructed patient to seek immediate medical attention if breathing worsens. Patient was grateful for call.

## 2015-11-07 ENCOUNTER — Encounter: Payer: Self-pay | Admitting: Physician Assistant

## 2015-11-07 ENCOUNTER — Ambulatory Visit (INDEPENDENT_AMBULATORY_CARE_PROVIDER_SITE_OTHER): Payer: Medicare Other | Admitting: *Deleted

## 2015-11-07 ENCOUNTER — Ambulatory Visit (INDEPENDENT_AMBULATORY_CARE_PROVIDER_SITE_OTHER): Payer: Medicare Other | Admitting: Physician Assistant

## 2015-11-07 ENCOUNTER — Encounter: Payer: Self-pay | Admitting: Internal Medicine

## 2015-11-07 VITALS — BP 160/50 | HR 63 | Ht 72.0 in | Wt 288.1 lb

## 2015-11-07 DIAGNOSIS — R9431 Abnormal electrocardiogram [ECG] [EKG]: Secondary | ICD-10-CM

## 2015-11-07 DIAGNOSIS — I10 Essential (primary) hypertension: Secondary | ICD-10-CM

## 2015-11-07 DIAGNOSIS — I5033 Acute on chronic diastolic (congestive) heart failure: Secondary | ICD-10-CM

## 2015-11-07 DIAGNOSIS — G4733 Obstructive sleep apnea (adult) (pediatric): Secondary | ICD-10-CM

## 2015-11-07 DIAGNOSIS — I2510 Atherosclerotic heart disease of native coronary artery without angina pectoris: Secondary | ICD-10-CM | POA: Diagnosis not present

## 2015-11-07 DIAGNOSIS — Z950 Presence of cardiac pacemaker: Secondary | ICD-10-CM

## 2015-11-07 DIAGNOSIS — N183 Chronic kidney disease, stage 3 (moderate): Secondary | ICD-10-CM | POA: Diagnosis not present

## 2015-11-07 DIAGNOSIS — I2583 Coronary atherosclerosis due to lipid rich plaque: Secondary | ICD-10-CM

## 2015-11-07 LAB — CBC WITH DIFFERENTIAL/PLATELET
BASOS PCT: 1 %
Basophils Absolute: 81 cells/uL (ref 0–200)
EOS ABS: 243 {cells}/uL (ref 15–500)
Eosinophils Relative: 3 %
HEMATOCRIT: 46 % (ref 38.5–50.0)
Hemoglobin: 15 g/dL (ref 13.2–17.1)
LYMPHS PCT: 18 %
Lymphs Abs: 1458 cells/uL (ref 850–3900)
MCH: 31.3 pg (ref 27.0–33.0)
MCHC: 32.6 g/dL (ref 32.0–36.0)
MCV: 96 fL (ref 80.0–100.0)
MONOS PCT: 8 %
MPV: 11.5 fL (ref 7.5–12.5)
Monocytes Absolute: 648 cells/uL (ref 200–950)
NEUTROS PCT: 70 %
Neutro Abs: 5670 cells/uL (ref 1500–7800)
PLATELETS: 129 10*3/uL — AB (ref 140–400)
RBC: 4.79 MIL/uL (ref 4.20–5.80)
RDW: 15.2 % — AB (ref 11.0–15.0)
WBC: 8.1 10*3/uL (ref 3.8–10.8)

## 2015-11-07 LAB — BASIC METABOLIC PANEL
BUN: 25 mg/dL (ref 7–25)
CO2: 31 mmol/L (ref 20–31)
CREATININE: 1.93 mg/dL — AB (ref 0.70–1.18)
Calcium: 8.8 mg/dL (ref 8.6–10.3)
Chloride: 98 mmol/L (ref 98–110)
GLUCOSE: 99 mg/dL (ref 65–99)
Potassium: 4 mmol/L (ref 3.5–5.3)
Sodium: 140 mmol/L (ref 135–146)

## 2015-11-07 LAB — CUP PACEART INCLINIC DEVICE CHECK
Date Time Interrogation Session: 20170525145220
Implantable Lead Implant Date: 20160226
Implantable Lead Location: 753859
Lead Channel Impedance Value: 400 Ohm
Lead Channel Pacing Threshold Amplitude: 1 V
Lead Channel Pacing Threshold Amplitude: 1 V
Lead Channel Pacing Threshold Pulse Width: 0.4 ms
Lead Channel Pacing Threshold Pulse Width: 0.4 ms
Lead Channel Sensing Intrinsic Amplitude: 2.9 mV
Lead Channel Setting Pacing Pulse Width: 0.4 ms
MDC IDC LEAD IMPLANT DT: 20160226
MDC IDC LEAD LOCATION: 753860
MDC IDC MSMT BATTERY REMAINING LONGEVITY: 110.4
MDC IDC MSMT BATTERY VOLTAGE: 2.99 V
MDC IDC MSMT LEADCHNL RA PACING THRESHOLD AMPLITUDE: 1 V
MDC IDC MSMT LEADCHNL RA PACING THRESHOLD AMPLITUDE: 1 V
MDC IDC MSMT LEADCHNL RA PACING THRESHOLD PULSEWIDTH: 0.4 ms
MDC IDC MSMT LEADCHNL RA PACING THRESHOLD PULSEWIDTH: 0.4 ms
MDC IDC MSMT LEADCHNL RV IMPEDANCE VALUE: 412.5 Ohm
MDC IDC MSMT LEADCHNL RV SENSING INTR AMPL: 11 mV
MDC IDC SET LEADCHNL RA PACING AMPLITUDE: 1.875
MDC IDC SET LEADCHNL RV PACING AMPLITUDE: 1.25 V
MDC IDC SET LEADCHNL RV SENSING SENSITIVITY: 3.5 mV
MDC IDC STAT BRADY RA PERCENT PACED: 94 %
MDC IDC STAT BRADY RV PERCENT PACED: 99.68 %
Pulse Gen Model: 2240
Pulse Gen Serial Number: 7710764

## 2015-11-07 MED ORDER — DILTIAZEM HCL ER COATED BEADS 240 MG PO CP24: 240.0000 mg | ORAL_CAPSULE | Freq: Every day | ORAL | Status: AC

## 2015-11-07 NOTE — Patient Instructions (Addendum)
Medication Instructions:  1. INCREASE LASIX TO 60 MG TWICE DAILY FOR 2 DAYS THEN CHANGE TO 40 MG TWICE DAILY; [facility] 2. INCREASE COREG TO 240 MG DAILY; RX SENT Labwork: 1. TODAY BMET, CBC W/DIFF, BNP 2. BMET IN 1 WEEK Testing/Procedures: NONE Follow-Up: 11/12/15 @ 2 PM WITH SCOTT WEAVER, PAC  Any Other Special Instructions Will Be Listed Below (If Applicable). If you need a refill on your cardiac medications before your next appointment, please call your pharmacy.

## 2015-11-07 NOTE — Progress Notes (Signed)
Add on pacemaker check in clinic for Tereso NewcomerScott Weaver, [place] for inappropriate V pacing on EKG. Normal device function. PVCs seemingly not sensed by device on EKG- after interrogation, PVCs are falling simultaneously with an atrial pace, falling in the blanking period and therefore functionally "undersensed". PVC count < 1 %, 11k since 09/13/15 (likely a low count if PVCs are falling simultaneously with A pace). Thresholds, sensing, impedances consistent with previous measurements. Device programmed to maximize longevity. 10 mode switches- all < 1min. No high ventricular rates noted. Device programmed at appropriate safety margins. Histogram distribution appropriate for patient activity level. Device programmed to optimize intrinsic conduction. Estimated longevity 9.2-10.1 years. Patient enrolled in remote follow-up. Merlin and OV as previously scheduled.

## 2015-11-07 NOTE — Progress Notes (Signed)
Cardiology Office Note:    Date:  11/07/2015   ID:  [name], DOB 20-May-1946, MRN [id number]  PCP:  [name], MD  Cardiologist:  Dr. Armanda Magic   Electrophysiologist: Dr. Lewayne Bunting   Referring MD: Jarome Matin, MD   Chief Complaint  Patient presents with  . Shortness of Breath    History of Present Illness:     [name] is a 70 y.o. 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 Patient was noted to be in CHB during an echo in 2/16 and subsequently underwent dual chamber PPM with Dr. Ladona Ridgel.  He was seen last year after visit to the emergency room 9/16 with volume excess in the setting of COPD exacerbation. Nuclear stress test was arranged and demonstrated normal perfusion. Echo demonstrated severe LVH with EF 65-70% and mild diastolic dysfunction.   Recently called in with increasing SOB and edema.  He has noted increasing LE edema and increasing dyspnea exertion over the past few weeks. His weight has been fluctuating up 4-6 pounds. He denies orthopnea or PND. He denies cough that has been wheezing. Denies chest discomfort. Denies syncope. Denies any bleeding issues.   Past Medical History  Diagnosis Date  . Neck abscess   . Diabetes mellitus   . Hypertension   . MRSA (methicillin resistant Staphylococcus aureus)   . Coronary artery disease cath 2006    nonobstructive ASCAD with 30% RCA  . Shortness of breath   . Sleep apnea     cpap  . Hypothyroidism   . Depression   . GERD (gastroesophageal reflux disease)   . Anxiety   . Hypertrophic cardiomyopathy (HCC)     a. Echo 9/16: Severe LVH of the septal wall and moderate LVH of the posterior wall, mild focal basal septal hypertrophy (hypertrophic cardiomyopathy), vigorous LVF, EF 65-70%, anteroseptal HK, grade 1 diastolic dysfunction, MAC, there is no SAM, normal RV function  . History of nuclear stress test     a. Myoview 9/16: EF 57%, normal  study    Past Surgical History  Procedure Laterality Date  . Back surgery    . Abscess drainage    . Amputation Right 02/22/2014    Procedure: AMPUTATION RAY RIGHT FOURTH AND FIFTH;  Surgeon: Toni Arthurs, MD;  Location: [facility] OR;  Service: Orthopedics;  Laterality: Right;  . Permanent pacemaker insertion N/A 08/10/2014    Procedure: PERMANENT PACEMAKER INSERTION;  Surgeon: Marinus Maw, MD;  Location: [facility] CATH LAB;  Service: Cardiovascular;  Laterality: N/A;    Current Medications: Outpatient Prescriptions Prior to Visit  Medication Sig Dispense Refill  . acetaminophen (TYLENOL) 325 MG tablet Take 1-2 tablets (325-650 mg total) by mouth every 4 (four) hours as needed for mild pain.    Marland Kitchen aspirin EC 81 MG tablet Take 81 mg by mouth daily.    . B-D INS SYR ULTRAFINE 1CC/30G 30G X 1/2" 1 ML MISC   12  . FLUoxetine (PROZAC) 10 MG capsule Take 10 mg by mouth daily.     . Fluticasone Furoate-Vilanterol 100-25 MCG/INH AEPB Inhale 1 puff into the lungs daily. 1 each 0  . furosemide (LASIX) 40 MG tablet Take 1 tablet (40 mg total) by mouth as directed. 40 MG DAILY FOR 3 DAYS THEN STOP UNLESS ADVISED BY CARDIOLOGY 30 tablet 2  . gabapentin (NEURONTIN) 300 MG capsule     . insulin NPH Human (HUMULIN N,NOVOLIN N) 100 UNIT/ML  injection Inject 20-30 Units into the skin 2 (two) times daily before a meal.    . insulin regular (NOVOLIN R,HUMULIN R) 100 units/mL injection Inject 10-15 Units into the skin 2 (two) times daily before a meal.     . levothyroxine (SYNTHROID, LEVOTHROID) 200 MCG tablet Take 200 mcg by mouth daily.  7  . Multiple Vitamin (MULITIVITAMIN WITH MINERALS) TABS Take 1 tablet by mouth daily.    . nitroGLYCERIN (NITROSTAT) 0.4 MG SL tablet Place 0.4 mg under the tongue every 5 (five) minutes as needed for chest pain (max 3 pills daily).    . ONE TOUCH ULTRA TEST test strip   12  . pantoprazole (PROTONIX) 40 MG tablet TAKE 1 TABLET BY MOUTH DAILY. TAKE 30-60 MIN BEFORE FIRST MEAL OF THE DAY  30 tablet 0  . predniSONE (DELTASONE) 20 MG tablet Take 1 tablet (20 mg total) by mouth daily with breakfast. X 7 DAYS    . diltiazem (CARDIZEM CD) 180 MG 24 hr capsule TAKE 1 CAPSULE (180 MG TOTAL) BY MOUTH DAILY. 30 capsule 5   No facility-administered medications prior to visit.      Allergies:   Penicillins   Social History   Social History  . Marital Status: Married    Spouse Name: N/A  . Number of Children: N/A  . Years of Education: N/A   Occupational History  . retired    Social History Main Topics  . Smoking status: Former Smoker -- 0.50 packs/day for 10 years    Types: Cigarettes    Quit date: 06/15/1973  . Smokeless tobacco: Never Used  . Alcohol Use: No  . Drug Use: No  . Sexual Activity: Not Asked   Other Topics Concern  . None   Social History Narrative     Family History:  The patient's family history includes Heart disease in his father and mother.   ROS:   Please see the history of present illness.    Review of Systems  Constitution: Positive for weight gain.  Cardiovascular: Positive for dyspnea on exertion.   All other systems reviewed and are negative.   Physical Exam:    VS:  BP 160/50 mmHg  Pulse 63  Ht 6' (1.829 m)  Wt 288 lb 1.9 oz (130.69 kg)  BMI 39.07 kg/m2  SpO2 90%   GEN: Well nourished, well developed, in no acute distress HEENT: normal Neck: I cannot assess JVD, no masses Cardiac: Normal S1/S2, RRR; no murmurs, rubs, or gallops, 1+ bilateral LE   Respiratory:  Decreased breath sounds bilaterally, no obvious rales GI: distended MS: no deformity or atrophy Skin: warm and dry Neuro: No focal deficits  Psych: Alert and oriented x 3, normal affect  Wt Readings from Last 3 Encounters:  11/07/15 288 lb 1.9 oz (130.69 kg)  09/13/15 267 lb 9.6 oz (121.383 kg)  04/08/15 287 lb 6.4 oz (130.364 kg)      Studies/Labs Reviewed:     EKG:  EKG is  ordered today.  The ekg ordered today demonstrates Intermittent AV pacing, failure  to sense, HR 63  Recent Labs: 03/04/2015: BUN 27*; Creatinine, Ser 2.03*; Hemoglobin 16.1; Platelets 144*; Potassium 4.4; Sodium 137 03/05/2015: Pro B Natriuretic peptide (BNP) 75.0   Recent Lipid Panel    Component Value Date/Time   CHOL  07/26/2008 0110    108        ATP III CLASSIFICATION:  <200     mg/dL   Desirable  161-096  mg/dL  Borderline High  >=240    mg/dL   High          TRIG 696* 07/26/2008 0110   HDL 18* 07/26/2008 0110   CHOLHDL 6.0 07/26/2008 0110   VLDL 75* 07/26/2008 0110   LDLCALC  07/26/2008 0110    15        Total Cholesterol/HDL:CHD Risk Coronary Heart Disease Risk Table                     Men   Women  1/2 Average Risk   3.4   3.3  Average Risk       5.0   4.4  2 X Average Risk   9.6   7.1  3 X Average Risk  23.4   11.0        Use the calculated Patient Ratio above and the CHD Risk Table to determine the patient's CHD Risk.        ATP III CLASSIFICATION (LDL):  <100     mg/dL   Optimal  295-284  mg/dL   Near or Above                    Optimal  130-159  mg/dL   Borderline  132-440  mg/dL   High  >102     mg/dL   Very High    Additional studies/ records that were reviewed today include:   Myoview 03/15/15 Myocardial perfusion is normal. The study is normal. This is a low risk study. Overall left ventricular systolic function was normal. LV cavity size is normal. Nuclear stress EF: 57%. The left ventricular ejection fraction is normal (55-65%). There are no significant changes in comparison to the prior study.  Echo 03/14/15 Severe LVH of the septal wall and moderate LVH of the posterior wall, mild focal basal septal hypertrophy, EF 65-70%, anteroseptal HK due to RV pacing or ischemia, grade 1 diastolic dysfunction, MAC  Nm Pulmonary Perf And Vent 03/11/2015  IMPRESSION: Small, subsegmental matching perfusion defects at the lung bases. Low probability ventilation perfusion lung scan for pulmonary embolism. Electronically Signed By: Rudie Meyer M.D. On: 03/11/2015 12:57   Echo 08/06/14 Severe LVH, EF 55-60%, mild LAE  Holter 07/26/14 Mobitz 2 with junctional escape  Myoview 05/2013 Low risk stress nuclear study with a fixed defect in the inferolateral wall c/w diaphragmatic attenuation. LV Ejection Fraction: Study not gated  LHC 07/2008 LM: Ok LAD: Mid 25% LCx: AV groove 25%, PLB prox 30% RCA: Luminal irregs EF 65%   ASSESSMENT:     1. Acute on chronic diastolic CHF (congestive heart failure), NYHA class 1 (HCC)   2. Coronary artery disease due to lipid rich plaque   3. CKD (chronic kidney disease), stage 3 (moderate)   4. Essential hypertension   5. OSA (obstructive sleep apnea)   6. Pacemaker implant- St Jude 08/10/14     PLAN:     In order of problems listed above:  1. A/C Diastolic CHF: He is volume overloaded. He is NYHA 3-3b. He took Lasix 40 mg daily for the past 3 days without significant improvement. He tells me he had a recent chest x-ray with primary care that was unremarkable. He denies significant excessive salt intake.  I believe that we can adjust his diuretics at home and keep him from going to the hospital.  But, I did tell him to go to the ED if he feels worse.  If he worsens, will need inpatient management.    -  Increase Lasix to 60 mg twice a day 2 days >> then reduce to 40 mg twice a day   -  BMET, BNP, CBC  -  BMET 1 week  -  Go to ED if worse or call if no better  -  Early FU with me or Dr. Armanda Magicraci Turner Monday or Tuesday of next week.  2. CAD: Non-obstructive by LHC in 2010.  Myoview in 9/16 was low risk.No angina.  Continue ASA.  3. Chronic Kidney Disease: Recent Creatinine 2.03.  Follow renal function and potassium closely with adjustments in Lasix.  4. HTN: BP elevated. Adjust Lasix as noted.   5. OSA: Continue CPAP.   6. S/p Pacemaker: Implanted 2/2 complete heart block. FU with EP as planned.  Pacer spikes falling in unusual places on electrocardiogram  today. Device was interrogated. He is having PVCs that are not recognized by the atrial lead.  PVC burden > 1%.  D/w Dr. Mayford Knifeurner.  PVC burden not that great but may be making him feel worse.  Will increase Diltiazem to 240 mg QD to help decrease amount of PVCs.     Medication Adjustments/Labs and Tests Ordered: Current medicines are reviewed at length with the patient today.  Concerns regarding medicines are outlined above.  Medication changes, Labs and Tests ordered today are outlined in the Patient Instructions noted below. Patient Instructions  Medication Instructions:  1. INCREASE LASIX TO 60 MG TWICE DAILY FOR 2 DAYS THEN CHANGE TO 40 MG TWICE DAILY; [facility] 2. INCREASE COREG TO 240 MG DAILY; RX SENT Labwork: 1. TODAY BMET, CBC W/DIFF, BNP 2. BMET IN 1 WEEK Testing/Procedures: NONE Follow-Up: 11/12/15 @ 2 PM WITH [name], PAC  Any Other Special Instructions Will Be Listed Below (If Applicable). If you need a refill on your cardiac medications before your next appointment, please call your pharmacy.   Signed, Tereso NewcomerScott Annalei Friesz, PA-C  11/07/2015 5:39 PM    [facility] [address], [place], [place]  [address] Phone: [phone]) [phone]; Fax: [phone]) [phone]

## 2015-11-08 ENCOUNTER — Telehealth: Payer: Self-pay | Admitting: *Deleted

## 2015-11-08 LAB — BRAIN NATRIURETIC PEPTIDE: Brain Natriuretic Peptide: 360 pg/mL — ABNORMAL HIGH (ref <100)

## 2015-11-08 NOTE — Telephone Encounter (Signed)
Pt notified of lab results and findings by phone with verbal understanding. Pt confirmed 5/30 appt 2 pm with Bing NeighborsScott W. PA.

## 2015-11-12 ENCOUNTER — Ambulatory Visit (INDEPENDENT_AMBULATORY_CARE_PROVIDER_SITE_OTHER): Payer: Medicare Other | Admitting: Physician Assistant

## 2015-11-12 ENCOUNTER — Encounter: Payer: Self-pay | Admitting: Physician Assistant

## 2015-11-12 VITALS — BP 128/60 | HR 68 | Ht 72.0 in | Wt 295.0 lb

## 2015-11-12 DIAGNOSIS — I10 Essential (primary) hypertension: Secondary | ICD-10-CM | POA: Diagnosis not present

## 2015-11-12 DIAGNOSIS — I5033 Acute on chronic diastolic (congestive) heart failure: Secondary | ICD-10-CM

## 2015-11-12 DIAGNOSIS — I2510 Atherosclerotic heart disease of native coronary artery without angina pectoris: Secondary | ICD-10-CM | POA: Diagnosis not present

## 2015-11-12 DIAGNOSIS — N183 Chronic kidney disease, stage 3 (moderate): Secondary | ICD-10-CM

## 2015-11-12 DIAGNOSIS — Z950 Presence of cardiac pacemaker: Secondary | ICD-10-CM

## 2015-11-12 DIAGNOSIS — J449 Chronic obstructive pulmonary disease, unspecified: Secondary | ICD-10-CM

## 2015-11-12 DIAGNOSIS — I2583 Coronary atherosclerosis due to lipid rich plaque: Secondary | ICD-10-CM

## 2015-11-12 DIAGNOSIS — G4733 Obstructive sleep apnea (adult) (pediatric): Secondary | ICD-10-CM

## 2015-11-12 NOTE — Patient Instructions (Addendum)
Medication Instructions:  Your physician recommends that you continue on your current medications as directed. Please refer to the Current Medication list given to you today. Labwork: TODAY BMET Testing/Procedures: NONE Follow-Up: 1. YOU ARE BEING REFERRED TO PULMONOLOGY 2. DR. Mayford KnifeURNER IN 2-3 MONTHS   Any Other Special Instructions Will Be Listed Below (If Applicable). PER SCOTT WEAVER, PAC HE WOULD LIKE FOR YOU TO WEAR YOUR OXYGEN AT ALL TIMES  If you need a refill on your cardiac medications before your next appointment, please call your pharmacy.

## 2015-11-12 NOTE — Progress Notes (Signed)
Cardiology Office Note:    Date:  11/12/2015   ID:  [name], DOB 02-22-1946, MRN [id number]  PCP:  [name], MD  Cardiologist:  Dr. Armanda Magic   Electrophysiologist: Dr. Lewayne Bunting   Referring MD: Jarome Matin, MD   Chief Complaint  Patient presents with  . Congestive Heart Failure    Follow up    History of Present Illness:     [name] is a 70 y.o. 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 Patient was noted to be in CHB during an echo in 2/16 and subsequently underwent dual chamber PPM with Dr. Ladona Ridgel.  He was seen last year after visit to the emergency room 9/16 with volume excess in the setting of COPD exacerbation. Nuclear stress test was arranged and demonstrated normal perfusion. Echo demonstrated severe LVH with EF 65-70% and mild diastolic dysfunction.   I saw him last week with volume overload.  I adjusted his Lasix and brought him back today for close FU.  Of note, he was having a lot of PVCs on his ECG.  His device was not recognizing these and was trying to A pace. I adjust his calcium channel blocker to hopefully lessen the amount of PVCs he was having.  He returns for FU.  He is here today with his wife.  He tells me that he is feeling much better. His leg edema is improved.  Denies orthopnea, PND.  Denies chest pain.  Denies syncope. He denies cough.  He does wheeze.  He still struggles with DOE. He is NYHA 3.   He has O2 at home but does not have a portable tank.  He does not wear O2 at night as he wears CPAP.     Past Medical History  Diagnosis Date  . Neck abscess   . Diabetes mellitus   . Hypertension   . MRSA (methicillin resistant Staphylococcus aureus)   . Coronary artery disease cath 2006    nonobstructive ASCAD with 30% RCA  . Shortness of breath   . Sleep apnea     cpap  . Hypothyroidism   . Depression   . GERD (gastroesophageal reflux disease)   . Anxiety   .  Hypertrophic cardiomyopathy (HCC)     a. Echo 9/16: Severe LVH of the septal wall and moderate LVH of the posterior wall, mild focal basal septal hypertrophy (hypertrophic cardiomyopathy), vigorous LVF, EF 65-70%, anteroseptal HK, grade 1 diastolic dysfunction, MAC, there is no SAM, normal RV function  . History of nuclear stress test     a. Myoview 9/16: EF 57%, normal study    Past Surgical History  Procedure Laterality Date  . Back surgery    . Abscess drainage    . Amputation Right 02/22/2014    Procedure: AMPUTATION RAY RIGHT FOURTH AND FIFTH;  Surgeon: Toni Arthurs, MD;  Location: [facility] OR;  Service: Orthopedics;  Laterality: Right;  . Permanent pacemaker insertion N/A 08/10/2014    Procedure: PERMANENT PACEMAKER INSERTION;  Surgeon: Marinus Maw, MD;  Location: [facility] CATH LAB;  Service: Cardiovascular;  Laterality: N/A;    Current Medications: Outpatient Prescriptions Prior to Visit  Medication Sig Dispense Refill  . aspirin EC 81 MG tablet Take 81 mg by mouth daily.    . B-D INS SYR ULTRAFINE 1CC/30G 30G X 1/2" 1 ML MISC   12  . diltiazem (CARDIZEM CD) 240 MG 24 hr capsule Take 1  capsule (240 mg total) by mouth daily. 90 capsule 3  . FLUoxetine (PROZAC) 10 MG capsule Take 10 mg by mouth daily.     . Fluticasone Furoate-Vilanterol 100-25 MCG/INH AEPB Inhale 1 puff into the lungs daily. 1 each 0  . furosemide (LASIX) 40 MG tablet Take 1 tablet (40 mg total) by mouth as directed. 40 MG DAILY FOR 3 DAYS THEN STOP UNLESS ADVISED BY CARDIOLOGY 30 tablet 2  . gabapentin (NEURONTIN) 300 MG capsule Take 300 mg by mouth 2 (two) times daily.     . insulin NPH Human (HUMULIN N,NOVOLIN N) 100 UNIT/ML injection Inject 20-30 Units into the skin 2 (two) times daily before a meal.    . insulin regular (NOVOLIN R,HUMULIN R) 100 units/mL injection Inject 10-15 Units into the skin 2 (two) times daily before a meal.     . levothyroxine (SYNTHROID, LEVOTHROID) 200 MCG tablet Take 200 mcg by mouth daily.  7    . Multiple Vitamin (MULITIVITAMIN WITH MINERALS) TABS Take 1 tablet by mouth daily.    . nitroGLYCERIN (NITROSTAT) 0.4 MG SL tablet Place 0.4 mg under the tongue every 5 (five) minutes as needed for chest pain (max 3 pills daily).    . ONE TOUCH ULTRA TEST test strip 1 each by Other route as needed (BLOOD SUGAR).   12  . pantoprazole (PROTONIX) 40 MG tablet TAKE 1 TABLET BY MOUTH DAILY. TAKE 30-60 MIN BEFORE FIRST MEAL OF THE DAY 30 tablet 0  . predniSONE (DELTASONE) 20 MG tablet Take 1 tablet (20 mg total) by mouth daily with breakfast. X 7 DAYS    . acetaminophen (TYLENOL) 325 MG tablet Take 1-2 tablets (325-650 mg total) by mouth every 4 (four) hours as needed for mild pain. (Patient not taking: Reported on 11/12/2015)     No facility-administered medications prior to visit.      Allergies:   Penicillins   Social History   Social History  . Marital Status: Married    Spouse Name: N/A  . Number of Children: N/A  . Years of Education: N/A   Occupational History  . retired    Social History Main Topics  . Smoking status: Former Smoker -- 0.50 packs/day for 10 years    Types: Cigarettes    Quit date: 06/15/1973  . Smokeless tobacco: Never Used  . Alcohol Use: No  . Drug Use: No  . Sexual Activity: Not Asked   Other Topics Concern  . None   Social History Narrative     Family History:  The patient's family history includes Heart disease in his father and mother.   ROS:   Please see the history of present illness.    Review of Systems  Cardiovascular: Positive for dyspnea on exertion.  Neurological: Positive for loss of balance.   All other systems reviewed and are negative.   Physical Exam:    VS:  BP 128/60 mmHg  Pulse 68  Ht 6' (1.829 m)  Wt 295 lb (133.811 kg)  BMI 40.00 kg/m2  SpO2 86%   GEN: Well nourished, well developed, in no acute distress HEENT: normal Neck: I cannot assess JVD, no masses Cardiac: Normal S1/S2, RRR; no murmurs, rubs, or gallops,  trace bilateral LE  edema Respiratory:  Decreased breath sounds bilaterally, no obvious rales, no egophony  GI: distended MS: no deformity or atrophy Skin: warm and dry Neuro: No focal deficits  Psych: Alert and oriented x 3, normal affect  Wt Readings from Last 3 Encounters:  11/12/15 295 lb (133.811 kg)  11/07/15 288 lb 1.9 oz (130.69 kg)  09/13/15 267 lb 9.6 oz (121.383 kg)      Studies/Labs Reviewed:     EKG:  EKG is   ordered today.  The ekg ordered today demonstrates AV paced HR 64, intrinsic beat noted on ECG as well  Recent Labs: 03/05/2015: Pro B Natriuretic peptide (BNP) 75.0 11/07/2015: Brain Natriuretic Peptide 360.0*; BUN 25; Creat 1.93*; Hemoglobin 15.0; Platelets 129*; Potassium 4.0; Sodium 140   Recent Lipid Panel    Component Value Date/Time   CHOL  07/26/2008 0110    108        ATP III CLASSIFICATION:  <200     mg/dL   Desirable  161-096  mg/dL   Borderline High  >=045    mg/dL   High          TRIG 409* 07/26/2008 0110   HDL 18* 07/26/2008 0110   CHOLHDL 6.0 07/26/2008 0110   VLDL 75* 07/26/2008 0110   LDLCALC  07/26/2008 0110    15        Total Cholesterol/HDL:CHD Risk Coronary Heart Disease Risk Table                     Men   Women  1/2 Average Risk   3.4   3.3  Average Risk       5.0   4.4  2 X Average Risk   9.6   7.1  3 X Average Risk  23.4   11.0        Use the calculated Patient Ratio above and the CHD Risk Table to determine the patient's CHD Risk.        ATP III CLASSIFICATION (LDL):  <100     mg/dL   Optimal  811-914  mg/dL   Near or Above                    Optimal  130-159  mg/dL   Borderline  782-956  mg/dL   High  >213     mg/dL   Very High    Additional studies/ records that were reviewed today include:   Myoview 03/15/15 Myocardial perfusion is normal. The study is normal. This is a low risk study. Overall left ventricular systolic function was normal. LV cavity size is normal. Nuclear stress EF: 57%. The left ventricular  ejection fraction is normal (55-65%). There are no significant changes in comparison to the prior study.  Echo 03/14/15 Severe LVH of the septal wall and moderate LVH of the posterior wall, mild focal basal septal hypertrophy, EF 65-70%, anteroseptal HK due to RV pacing or ischemia, grade 1 diastolic dysfunction, MAC  Nm Pulmonary Perf And Vent 03/11/2015  IMPRESSION: Small, subsegmental matching perfusion defects at the lung bases. Low probability ventilation perfusion lung scan for pulmonary embolism. Electronically Signed By: Rudie Meyer M.D. On: 03/11/2015 12:57   Echo 08/06/14 Severe LVH, EF 55-60%, mild LAE  Holter 07/26/14 Mobitz 2 with junctional escape  Myoview 05/2013 Low risk stress nuclear study with a fixed defect in the inferolateral wall c/w diaphragmatic attenuation. LV Ejection Fraction: Study not gated  LHC 07/2008 LM: Ok LAD: Mid 25% LCx: AV groove 25%, PLB prox 30% RCA: Luminal irregs EF 65%   ASSESSMENT:     1. Acute on chronic diastolic CHF (congestive heart failure), NYHA class 1 (HCC)   2. Chronic obstructive pulmonary disease, unspecified COPD type (HCC)   3. Coronary  artery disease due to lipid rich plaque   4. CKD (chronic kidney disease), stage 3 (moderate)   5. Essential hypertension   6. OSA (obstructive sleep apnea)   7. Pacemaker implant- [employer] Jude 08/10/14     PLAN:     In order of problems listed above:  1. A/C Diastolic CHF: Echo in 9/16 with normal EF, severe LVH and mild diastolic dysfunction.  Volume status is improved with increase in diuretics.  Weights at home are down 4-5 lbs.  He feels better.  However, he remains short of breath and is NYHA 3.  He is hypoxic and his dyspnea is likely multifactorial.  I will continue his current dose of diuretic and repeat a BMET today.    2. COPD - PFTs in 1/15 with severe restriction.  He is hypoxic.  I suspect his dyspnea is multifactorial and related to COPD, CHF, OSA, obesity,  deconditioning.  He may have an element of OHS as well.  He already has O2 at home and wears it most of the day. He does not have a portable tank.  I think his pulmonary issues are driving a lot of his current symptoms.  I believe he would benefit from returning to Pulmonology for evaluation and management.    -  I have asked him to use O2 continuously.  -  Will ask his Home Health agency to get him a portable unit  -  Refer to Pulmonology.    -  Continue to work on weight loss.  3. CAD: Non-obstructive by LHC in 2010.  Myoview in 9/16 was low risk. No angina.  Continue ASA.  4. Chronic Kidney Disease: Recent Creatinine 1.93.  Repeat BMET today.     5. HTN: BP controlled.    6. OSA: Continue CPAP.   7. S/p Pacemaker: Implanted 2/2 complete heart block. FU with EP as planned. Failure to sense no longer noted on current ECG.     Medication Adjustments/Labs and Tests Ordered: Current medicines are reviewed at length with the patient today.  Concerns regarding medicines are outlined above.  Medication changes, Labs and Tests ordered today are outlined in the Patient Instructions noted below. Patient Instructions  Medication Instructions:  Your physician recommends that you continue on your current medications as directed. Please refer to the Current Medication list given to you today. Labwork: TODAY BMET Testing/Procedures: NONE Follow-Up: 1. YOU ARE BEING REFERRED TO PULMONOLOGY 2. DR. Mayford Knife IN 2-3 MONTHS   Any Other Special Instructions Will Be Listed Below (If Applicable). PER Ryanne Morand, PAC HE WOULD LIKE FOR YOU TO WEAR YOUR OXYGEN AT ALL TIMES  If you need a refill on your cardiac medications before your next appointment, please call your pharmacy.   Signed, Tereso Newcomer, PA-C  11/12/2015 3:01 PM    [facility] [address] [place], [place], [place]  [address] Phone: [phone]; Fax: [phone]

## 2015-11-13 ENCOUNTER — Telehealth: Payer: Self-pay | Admitting: *Deleted

## 2015-11-13 LAB — BASIC METABOLIC PANEL
BUN: 29 mg/dL — AB (ref 7–25)
CO2: 35 mmol/L — ABNORMAL HIGH (ref 20–31)
CREATININE: 2.03 mg/dL — AB (ref 0.70–1.18)
Calcium: 8.6 mg/dL (ref 8.6–10.3)
Chloride: 99 mmol/L (ref 98–110)
Glucose, Bld: 73 mg/dL (ref 65–99)
POTASSIUM: 4 mmol/L (ref 3.5–5.3)
Sodium: 142 mmol/L (ref 135–146)

## 2015-11-13 NOTE — Telephone Encounter (Signed)
Pt notified of lab results by phone with verbal understanding. Pt has Pulmonology appt 12/06/15.

## 2015-12-04 ENCOUNTER — Ambulatory Visit: Payer: Medicare Other | Admitting: Internal Medicine

## 2015-12-06 ENCOUNTER — Ambulatory Visit: Payer: Medicare Other | Admitting: Internal Medicine

## 2015-12-14 DEATH — deceased

## 2016-02-06 ENCOUNTER — Ambulatory Visit: Payer: Medicare Other | Admitting: Cardiology

## 2016-04-15 IMAGING — CR DG CHEST 2V
2 series · 2 of 2 positions shown · non-contrast
Comparison: June 28, 2013.

CLINICAL DATA: Open wound.

EXAM:
CHEST  2 VIEW

[w chest pa]
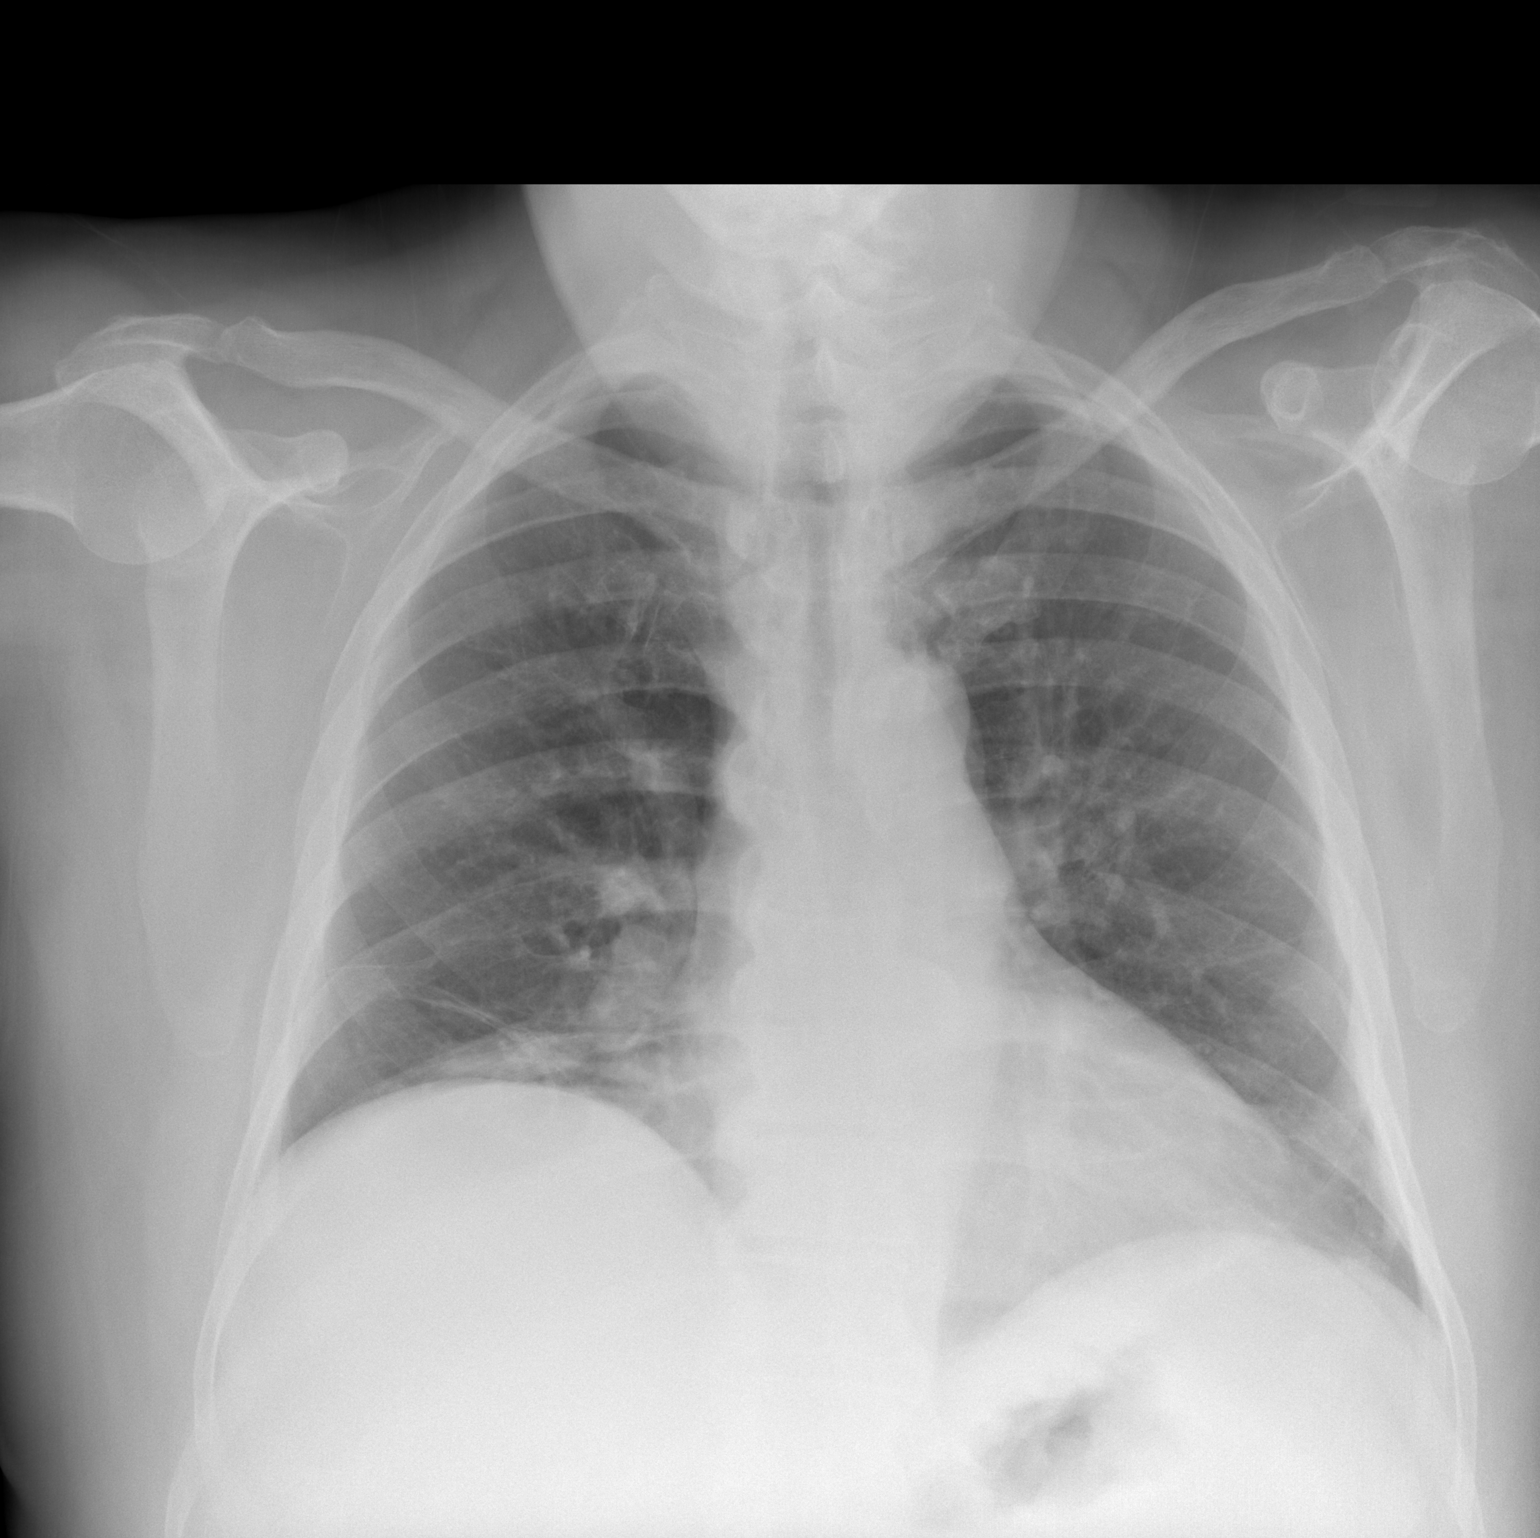

[w chest lat]
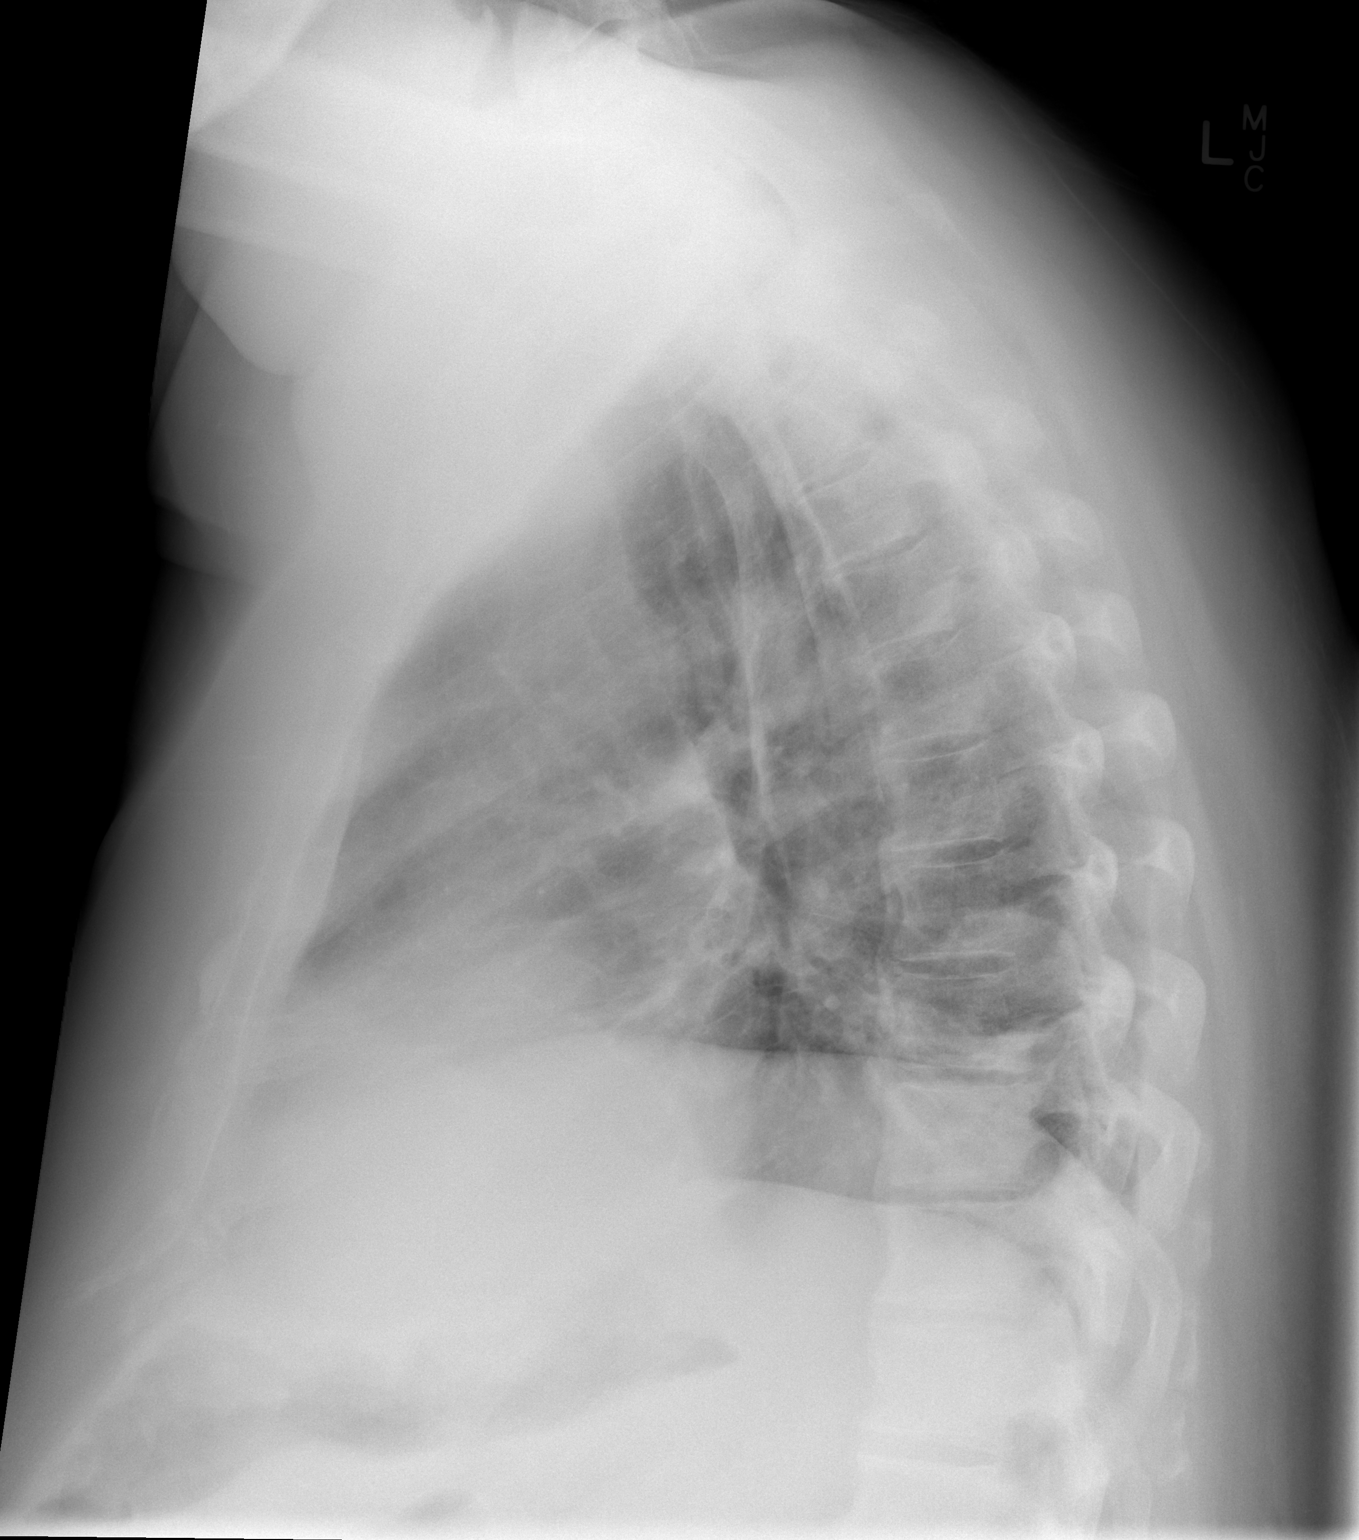

[2 of 2 positions shown; findings below may reference images not displayed]

FINDINGS: The heart size and mediastinal contours are within normal limits. No
pneumothorax or pleural effusion is noted. Left lung is clear. Mild
right basilar opacity is noted which is unchanged compared to prior
exam consistent with scarring or possibly subsegmental atelectasis.
The visualized skeletal structures are unremarkable.
IMPRESSION: Stable mild right basilar opacity is noted most consistent with
scarring or possibly subsegmental atelectasis. No other abnormality
seen.

## 2017-05-05 IMAGING — CR DG CHEST 2V
2 series · 2 of 2 positions shown · non-contrast
Comparison: 08/11/2014, 02/12/2014 and 06/28/2013

CLINICAL DATA: Left sided stabbing pain 4 days. Shortness of
breath.

EXAM:
CHEST  2 VIEW

[chest pa]
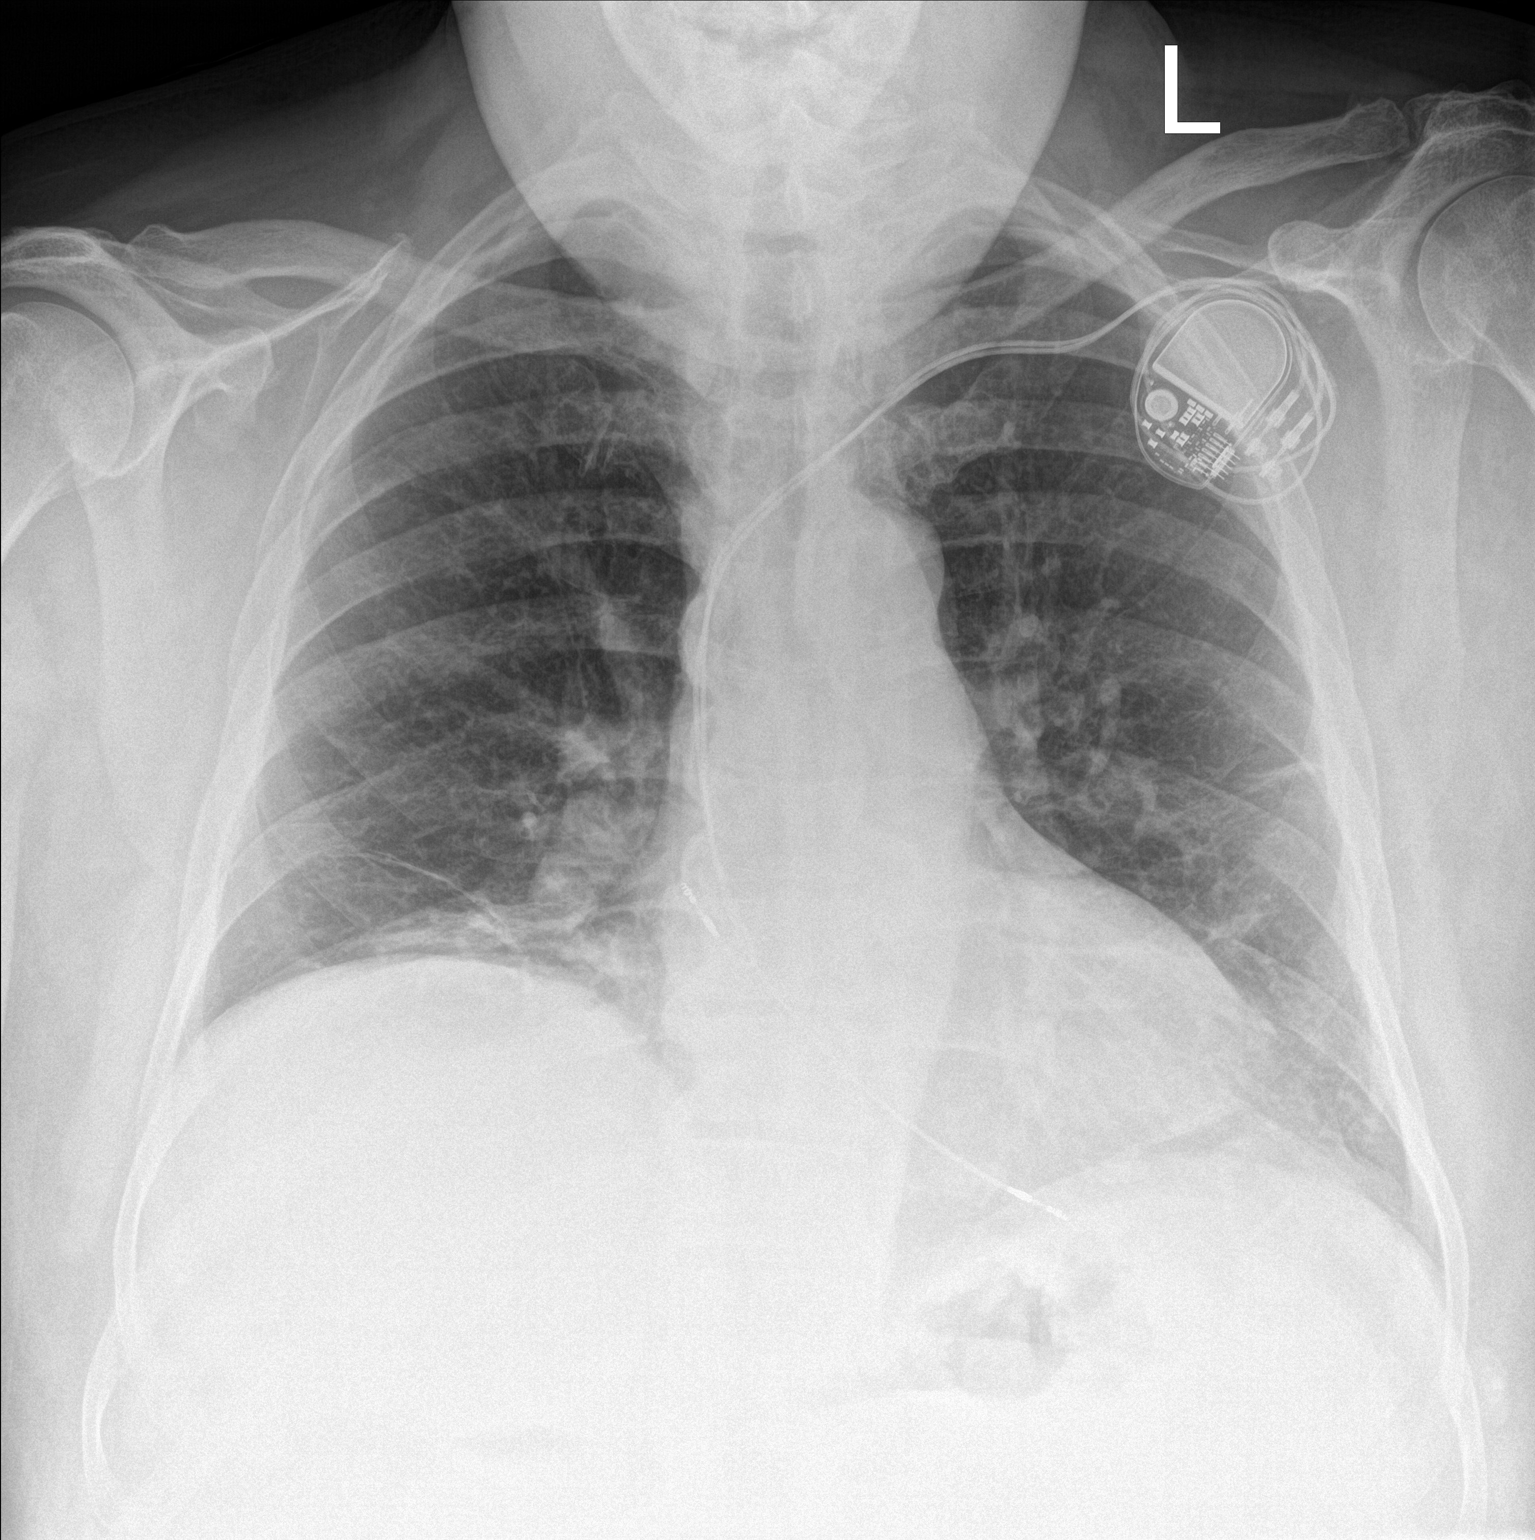

[chest lat]
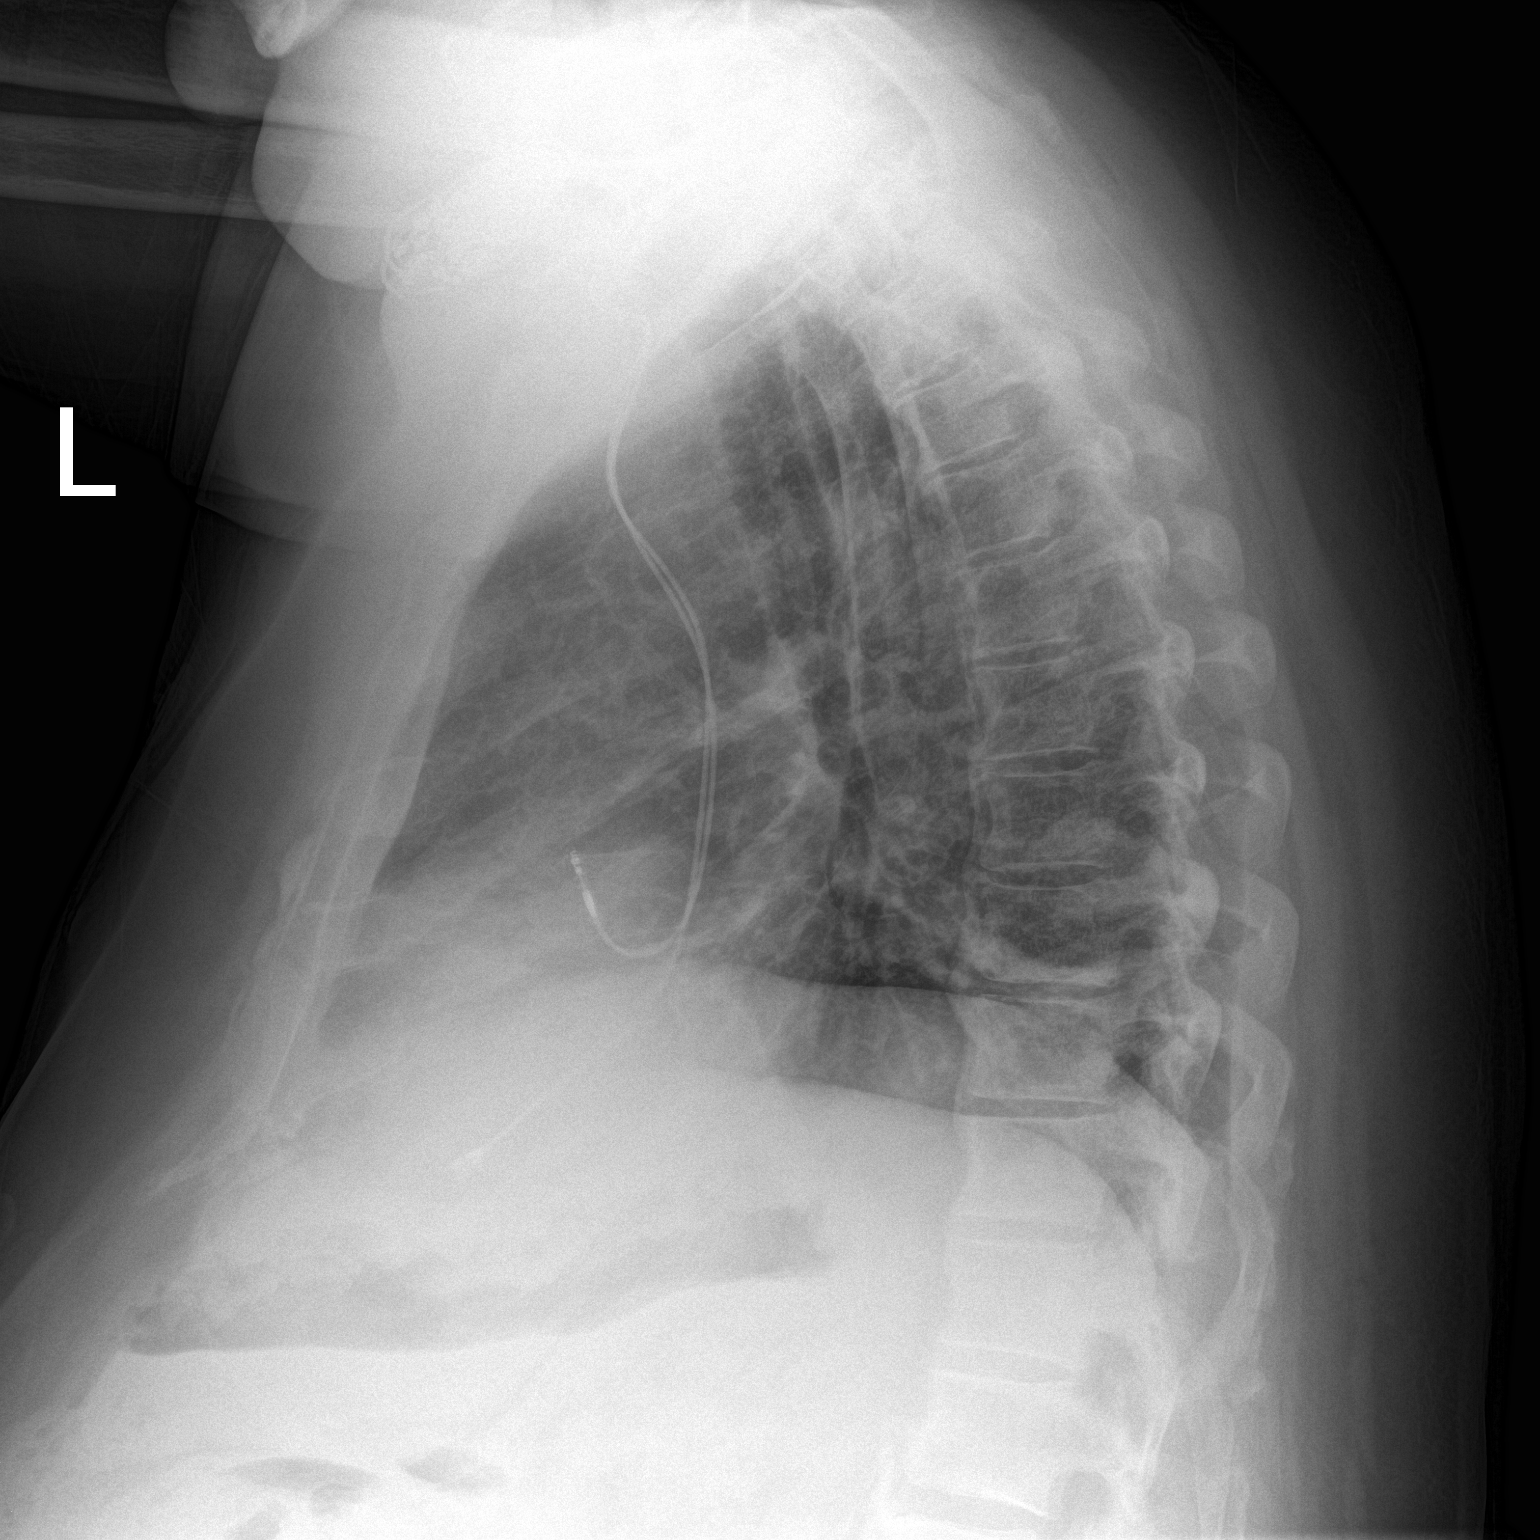

[2 of 2 positions shown; findings below may reference images not displayed]

FINDINGS: Left-sided pacemaker unchanged. Lungs are adequately inflated with
minimal linear density over the right base and left midlung
compatible with scarring and not significantly changed. No focal
lobar consolidation or effusion. Cardiomediastinal silhouette is
within normal. There are mild degenerative changes of the spine.
IMPRESSION: No acute findings.  Stable bibasilar scarring/atelectasis.
